# Patient Record
Sex: Female | Born: 1980
Health system: Southern US, Community
[De-identification: ages and names within clinical notes are randomized; demographics above are authoritative.]

## PROBLEM LIST (undated history)

## (undated) DIAGNOSIS — K5289 Other specified noninfective gastroenteritis and colitis: Secondary | ICD-10-CM

## (undated) DIAGNOSIS — K921 Melena: Secondary | ICD-10-CM

## (undated) DIAGNOSIS — M797 Fibromyalgia: Secondary | ICD-10-CM

## (undated) DIAGNOSIS — J45909 Unspecified asthma, uncomplicated: Secondary | ICD-10-CM

## (undated) DIAGNOSIS — IMO0001 Reserved for inherently not codable concepts without codable children: Secondary | ICD-10-CM

## (undated) DIAGNOSIS — K219 Gastro-esophageal reflux disease without esophagitis: Secondary | ICD-10-CM

## (undated) DIAGNOSIS — R109 Unspecified abdominal pain: Secondary | ICD-10-CM

## (undated) DIAGNOSIS — K5900 Constipation, unspecified: Secondary | ICD-10-CM

## (undated) DIAGNOSIS — R42 Dizziness and giddiness: Secondary | ICD-10-CM

## (undated) DIAGNOSIS — K220 Achalasia of cardia: Secondary | ICD-10-CM

## (undated) DIAGNOSIS — R609 Edema, unspecified: Secondary | ICD-10-CM

## (undated) DIAGNOSIS — J309 Allergic rhinitis, unspecified: Secondary | ICD-10-CM

## (undated) HISTORY — DX: Reserved for inherently not codable concepts without codable children: IMO0001

## (undated) HISTORY — DX: Unspecified abdominal pain: R10.9

## (undated) HISTORY — DX: Morbid (severe) obesity due to excess calories: E66.01

## (undated) HISTORY — DX: Unspecified asthma, uncomplicated: J45.909

## (undated) HISTORY — DX: Gastro-esophageal reflux disease without esophagitis: K21.9

## (undated) HISTORY — DX: Edema, unspecified: R60.9

## (undated) HISTORY — DX: Dizziness and giddiness: R42

## (undated) HISTORY — DX: Other specified noninfective gastroenteritis and colitis: K52.89

## (undated) HISTORY — DX: Fibromyalgia: M79.7

## (undated) HISTORY — DX: Allergic rhinitis, unspecified: J30.9

## (undated) HISTORY — DX: Melena: K92.1

## (undated) HISTORY — DX: Achalasia of cardia: K22.0

## (undated) HISTORY — DX: Constipation, unspecified: K59.00

## (undated) HISTORY — PX: CHOLECYSTECTOMY: SHX55

---
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
## 2002-11-21 ENCOUNTER — Encounter: Payer: Self-pay | Admitting: Internal Medicine

## 2002-11-21 ENCOUNTER — Ambulatory Visit (HOSPITAL_COMMUNITY): Admission: RE | Admit: 2002-11-21 | Discharge: 2002-11-21 | Payer: Self-pay | Admitting: Internal Medicine

## 2003-10-11 ENCOUNTER — Other Ambulatory Visit: Admission: RE | Admit: 2003-10-11 | Discharge: 2003-10-11 | Payer: Self-pay | Admitting: Obstetrics and Gynecology

## 2003-10-12 ENCOUNTER — Other Ambulatory Visit: Admission: RE | Admit: 2003-10-12 | Discharge: 2003-10-12 | Payer: Self-pay | Admitting: Obstetrics and Gynecology

## 2004-02-09 ENCOUNTER — Inpatient Hospital Stay (HOSPITAL_COMMUNITY): Admission: AD | Admit: 2004-02-09 | Discharge: 2004-02-09 | Payer: Self-pay | Admitting: Obstetrics and Gynecology

## 2004-02-29 ENCOUNTER — Ambulatory Visit (HOSPITAL_COMMUNITY): Admission: RE | Admit: 2004-02-29 | Discharge: 2004-02-29 | Payer: Self-pay | Admitting: Gastroenterology

## 2004-02-29 ENCOUNTER — Encounter: Payer: Self-pay | Admitting: Gastroenterology

## 2004-02-29 ENCOUNTER — Inpatient Hospital Stay (HOSPITAL_COMMUNITY): Admission: AD | Admit: 2004-02-29 | Discharge: 2004-02-29 | Payer: Self-pay | Admitting: Obstetrics and Gynecology

## 2004-02-29 HISTORY — PX: ESOPHAGOGASTRODUODENOSCOPY: SHX1529

## 2004-03-11 ENCOUNTER — Encounter (INDEPENDENT_AMBULATORY_CARE_PROVIDER_SITE_OTHER): Payer: Self-pay | Admitting: Specialist

## 2004-03-11 ENCOUNTER — Inpatient Hospital Stay (HOSPITAL_COMMUNITY): Admission: AD | Admit: 2004-03-11 | Discharge: 2004-03-13 | Payer: Self-pay | Admitting: Obstetrics and Gynecology

## 2004-04-06 ENCOUNTER — Emergency Department (HOSPITAL_COMMUNITY): Admission: EM | Admit: 2004-04-06 | Discharge: 2004-04-06 | Payer: Self-pay | Admitting: Emergency Medicine

## 2004-06-21 ENCOUNTER — Ambulatory Visit: Payer: Self-pay | Admitting: Internal Medicine

## 2004-07-15 ENCOUNTER — Emergency Department (HOSPITAL_COMMUNITY): Admission: EM | Admit: 2004-07-15 | Discharge: 2004-07-15 | Payer: Self-pay | Admitting: Emergency Medicine

## 2004-07-16 ENCOUNTER — Ambulatory Visit (HOSPITAL_COMMUNITY): Admission: RE | Admit: 2004-07-16 | Discharge: 2004-07-16 | Payer: Self-pay | Admitting: Emergency Medicine

## 2004-07-19 ENCOUNTER — Ambulatory Visit: Payer: Self-pay | Admitting: Internal Medicine

## 2004-08-12 ENCOUNTER — Ambulatory Visit: Payer: Self-pay | Admitting: Pulmonary Disease

## 2004-08-12 ENCOUNTER — Encounter (INDEPENDENT_AMBULATORY_CARE_PROVIDER_SITE_OTHER): Payer: Self-pay | Admitting: *Deleted

## 2004-08-12 ENCOUNTER — Observation Stay (HOSPITAL_COMMUNITY): Admission: RE | Admit: 2004-08-12 | Discharge: 2004-08-13 | Payer: Self-pay | Admitting: General Surgery

## 2005-03-05 ENCOUNTER — Ambulatory Visit: Payer: Self-pay | Admitting: Gastroenterology

## 2005-03-11 ENCOUNTER — Ambulatory Visit: Payer: Self-pay | Admitting: Gastroenterology

## 2005-03-11 ENCOUNTER — Ambulatory Visit (HOSPITAL_COMMUNITY): Admission: RE | Admit: 2005-03-11 | Discharge: 2005-03-11 | Payer: Self-pay | Admitting: Gastroenterology

## 2006-01-15 ENCOUNTER — Emergency Department (HOSPITAL_COMMUNITY): Admission: EM | Admit: 2006-01-15 | Discharge: 2006-01-15 | Payer: Self-pay | Admitting: Emergency Medicine

## 2006-07-23 ENCOUNTER — Ambulatory Visit: Payer: Self-pay | Admitting: Endocrinology

## 2006-07-23 LAB — CONVERTED CEMR LAB
ALT: 18 units/L (ref 0–40)
Alkaline Phosphatase: 56 units/L (ref 39–117)
Basophils Relative: 0 % (ref 0.0–1.0)
Bilirubin, Direct: 0.1 mg/dL (ref 0.0–0.3)
HCT: 36.1 % (ref 36.0–46.0)
Hemoglobin: 11.9 g/dL — ABNORMAL LOW (ref 12.0–15.0)
Lymphocytes Relative: 29.2 % (ref 12.0–46.0)
MCHC: 33 g/dL (ref 30.0–36.0)
Neutrophils Relative %: 60.6 % (ref 43.0–77.0)
Platelets: 291 10*3/uL (ref 150–400)
RBC: 4.55 M/uL (ref 3.87–5.11)
RDW: 13 % (ref 11.5–14.6)
Total Bilirubin: 0.4 mg/dL (ref 0.3–1.2)
Total Protein: 6.6 g/dL (ref 6.0–8.3)

## 2006-12-01 ENCOUNTER — Emergency Department (HOSPITAL_COMMUNITY): Admission: EM | Admit: 2006-12-01 | Discharge: 2006-12-01 | Payer: Self-pay | Admitting: Emergency Medicine

## 2007-02-10 ENCOUNTER — Encounter: Payer: Self-pay | Admitting: *Deleted

## 2007-08-19 ENCOUNTER — Ambulatory Visit: Payer: Self-pay | Admitting: Internal Medicine

## 2007-08-19 DIAGNOSIS — K219 Gastro-esophageal reflux disease without esophagitis: Secondary | ICD-10-CM

## 2007-08-19 DIAGNOSIS — K5289 Other specified noninfective gastroenteritis and colitis: Secondary | ICD-10-CM | POA: Insufficient documentation

## 2007-08-19 DIAGNOSIS — K220 Achalasia of cardia: Secondary | ICD-10-CM | POA: Insufficient documentation

## 2007-08-19 DIAGNOSIS — J45909 Unspecified asthma, uncomplicated: Secondary | ICD-10-CM

## 2007-08-19 DIAGNOSIS — J309 Allergic rhinitis, unspecified: Secondary | ICD-10-CM

## 2007-08-19 HISTORY — DX: Morbid (severe) obesity due to excess calories: E66.01

## 2007-08-19 HISTORY — DX: Gastro-esophageal reflux disease without esophagitis: K21.9

## 2007-08-19 HISTORY — DX: Allergic rhinitis, unspecified: J30.9

## 2007-08-19 HISTORY — DX: Unspecified asthma, uncomplicated: J45.909

## 2007-08-19 HISTORY — DX: Other specified noninfective gastroenteritis and colitis: K52.89

## 2007-08-19 HISTORY — DX: Achalasia of cardia: K22.0

## 2007-12-15 ENCOUNTER — Telehealth (INDEPENDENT_AMBULATORY_CARE_PROVIDER_SITE_OTHER): Payer: Self-pay | Admitting: *Deleted

## 2008-03-10 ENCOUNTER — Ambulatory Visit: Payer: Self-pay | Admitting: Internal Medicine

## 2008-03-10 DIAGNOSIS — K921 Melena: Secondary | ICD-10-CM | POA: Insufficient documentation

## 2008-03-10 HISTORY — DX: Melena: K92.1

## 2008-10-02 ENCOUNTER — Encounter: Payer: Self-pay | Admitting: Internal Medicine

## 2008-10-09 ENCOUNTER — Encounter: Admission: RE | Admit: 2008-10-09 | Discharge: 2008-10-09 | Payer: Self-pay | Admitting: Specialist

## 2008-11-10 ENCOUNTER — Inpatient Hospital Stay (HOSPITAL_COMMUNITY): Admission: AD | Admit: 2008-11-10 | Discharge: 2008-11-10 | Payer: Self-pay | Admitting: Obstetrics & Gynecology

## 2009-01-05 ENCOUNTER — Observation Stay (HOSPITAL_COMMUNITY): Admission: AD | Admit: 2009-01-05 | Discharge: 2009-01-06 | Payer: Self-pay | Admitting: Obstetrics & Gynecology

## 2009-02-05 ENCOUNTER — Telehealth: Payer: Self-pay | Admitting: Gastroenterology

## 2009-02-05 ENCOUNTER — Ambulatory Visit: Payer: Self-pay | Admitting: Gastroenterology

## 2009-04-08 ENCOUNTER — Inpatient Hospital Stay (HOSPITAL_COMMUNITY): Admission: AD | Admit: 2009-04-08 | Discharge: 2009-04-08 | Payer: Self-pay | Admitting: Obstetrics and Gynecology

## 2009-05-21 ENCOUNTER — Ambulatory Visit: Payer: Self-pay | Admitting: Physician Assistant

## 2009-05-21 ENCOUNTER — Inpatient Hospital Stay (HOSPITAL_COMMUNITY): Admission: AD | Admit: 2009-05-21 | Discharge: 2009-05-21 | Payer: Self-pay | Admitting: Obstetrics and Gynecology

## 2009-06-06 ENCOUNTER — Inpatient Hospital Stay (HOSPITAL_COMMUNITY): Admission: RE | Admit: 2009-06-06 | Discharge: 2009-06-08 | Payer: Self-pay | Admitting: Obstetrics and Gynecology

## 2009-07-31 ENCOUNTER — Ambulatory Visit: Payer: Self-pay | Admitting: Internal Medicine

## 2009-08-02 ENCOUNTER — Emergency Department (HOSPITAL_COMMUNITY): Admission: EM | Admit: 2009-08-02 | Discharge: 2009-08-03 | Payer: Self-pay | Admitting: Emergency Medicine

## 2009-08-02 ENCOUNTER — Encounter: Payer: Self-pay | Admitting: Internal Medicine

## 2009-10-10 ENCOUNTER — Ambulatory Visit: Payer: Self-pay | Admitting: Internal Medicine

## 2009-10-10 DIAGNOSIS — R609 Edema, unspecified: Secondary | ICD-10-CM

## 2009-10-10 DIAGNOSIS — IMO0001 Reserved for inherently not codable concepts without codable children: Secondary | ICD-10-CM

## 2009-10-10 DIAGNOSIS — R109 Unspecified abdominal pain: Secondary | ICD-10-CM | POA: Insufficient documentation

## 2009-10-10 HISTORY — DX: Reserved for inherently not codable concepts without codable children: IMO0001

## 2009-10-10 HISTORY — DX: Unspecified abdominal pain: R10.9

## 2009-10-10 HISTORY — DX: Edema, unspecified: R60.9

## 2009-10-10 LAB — CONVERTED CEMR LAB
BUN: 17 mg/dL (ref 6–23)
Basophils Absolute: 0 10*3/uL (ref 0.0–0.1)
Basophils Relative: 0.5 % (ref 0.0–3.0)
Bilirubin Urine: NEGATIVE
CO2: 30 meq/L (ref 19–32)
Calcium: 9.4 mg/dL (ref 8.4–10.5)
Eosinophils Absolute: 0.2 10*3/uL (ref 0.0–0.7)
Eosinophils Relative: 4.2 % (ref 0.0–5.0)
Glucose, Bld: 81 mg/dL (ref 70–99)
HCT: 34.2 % — ABNORMAL LOW (ref 36.0–46.0)
Lymphocytes Relative: 40.3 % (ref 12.0–46.0)
Lymphs Abs: 1.4 10*3/uL (ref 0.7–4.0)
MCHC: 33.7 g/dL (ref 30.0–36.0)
Monocytes Absolute: 0.3 10*3/uL (ref 0.1–1.0)
Neutrophils Relative %: 45.6 % (ref 43.0–77.0)
Platelets: 265 10*3/uL (ref 150.0–400.0)
Sodium: 142 meq/L (ref 135–145)
Total Bilirubin: 0.5 mg/dL (ref 0.3–1.2)
Total CK: 153 units/L (ref 7–177)
Urobilinogen, UA: 0.2 (ref 0.0–1.0)
pH: 6 (ref 5.0–8.0)

## 2009-10-29 ENCOUNTER — Ambulatory Visit: Payer: Self-pay | Admitting: Internal Medicine

## 2009-10-29 DIAGNOSIS — K5900 Constipation, unspecified: Secondary | ICD-10-CM | POA: Insufficient documentation

## 2009-10-29 HISTORY — DX: Constipation, unspecified: K59.00

## 2010-02-13 ENCOUNTER — Ambulatory Visit: Payer: Self-pay | Admitting: Internal Medicine

## 2010-02-13 DIAGNOSIS — R42 Dizziness and giddiness: Secondary | ICD-10-CM

## 2010-02-13 HISTORY — DX: Dizziness and giddiness: R42

## 2010-06-25 NOTE — Assessment & Plan Note (Signed)
Summary: headaches/dizziness/lb   Vital Signs:  Patient profile:   30 year old female Height:      67 inches Weight:      226.50 pounds BMI:     35.60 O2 Sat:      97 % on Room air Temp:     98.3 degrees F oral Pulse rate:   80 / minute BP supine:   102 / 82  (right arm) BP sitting:   110 / 84  (right arm) BP standing:   100 / 80  (right arm) Cuff size:   large  Vitals Entered By: Zella Ball Ewing CMA Duncan Dull) (February 13, 2010 9:35 AM)  O2 Flow:  Room air  CC: Headache, dizzines for 2 weeks/RE   CC:  Headache and dizzines for 2 weeks/RE.  History of Present Illness: here to f/u with acute - c/o 2 wks onset symptoms or fatigue, headaches, early ST now resolved and decreased by mouth intake, now with dizziness on standing;  no falls or injury and Pt denies CP, worsening sob, doe, wheezing, orthopnea, pnd, worsening LE edema, palps, dizziness or syncope  Pt denies new neuro symptoms such as  facial or extremity weakness No fever, wt loss, night sweats, loss of appetite or other constitutional symptoms    Denies worsening depressive symtpoms, suicidal ideation or panic.  Denies polydipsia, polyuria.  Did have dizziness on standing with orthostatic BP as above.  Denies GU symptoms such as dysuria or freq or urgency.  Problems Prior to Update: 1)  Dizziness  (ICD-780.4) 2)  Constipation  (ICD-564.00) 3)  Peripheral Edema  (ICD-782.3) 4)  Abdominal Pain, Unspecified Site  (ICD-789.00) 5)  Myalgia  (ICD-729.1) 6)  Preventive Health Care  (ICD-V70.0) 7)  Hematochezia  (ICD-578.1) 8)  Gastroenteritis  (ICD-558.9) 9)  Obesity, Morbid  (ICD-278.01) 10)  Achalasia  (ICD-530.0) 11)  Gerd  (ICD-530.81) 12)  Asthma  (ICD-493.90) 13)  Allergic Rhinitis  (ICD-477.9)  Medications Prior to Update: 1)  Valtrex 500 Mg Tabs (Valacyclovir Hcl) .Marland Kitchen.. 1 By Mouth Daily 2)  Protonix 40 Mg Tbec (Pantoprazole Sodium) .Marland Kitchen.. 1 By Mouth Once Daily 3)  Promethazine Hcl 25 Mg Tabs (Promethazine Hcl) .Marland Kitchen.. 1po Q  6 Hrs As Needed Nausea 4)  Hydrochlorothiazide 12.5 Mg Caps (Hydrochlorothiazide) .Marland Kitchen.. 1 -2 By Mouth Once Daily As Needed Swelling 5)  Golytely 236 Gm Solr (Peg 3350-Kcl-Nabcb-Nacl-Nasulf) .Marland Kitchen.. 1 Glass Q 30 Min Until Bm 6)  Lactulose  Soln (Lactulose) .... Generic - 30 - 60 Cc Two Times A Day As Needed  Current Medications (verified): 1)  Valtrex 500 Mg Tabs (Valacyclovir Hcl) .Marland Kitchen.. 1 By Mouth Daily 2)  Protonix 40 Mg Tbec (Pantoprazole Sodium) .Marland Kitchen.. 1 By Mouth Once Daily 3)  Promethazine Hcl 25 Mg Tabs (Promethazine Hcl) .Marland Kitchen.. 1po Q 6 Hrs As Needed Nausea 4)  Hydrochlorothiazide 12.5 Mg Caps (Hydrochlorothiazide) .Marland Kitchen.. 1 -2 By Mouth Once Daily As Needed Swelling 5)  Golytely 236 Gm Solr (Peg 3350-Kcl-Nabcb-Nacl-Nasulf) .Marland Kitchen.. 1 Glass Q 30 Min Until Bm 6)  Lactulose  Soln (Lactulose) .... Generic - 30 - 60 Cc Two Times A Day As Needed  Allergies (verified): 1)  ! 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  , not ill appearing Head:  normocephalic and atraumatic.   Eyes:  vision grossly intact, pupils equal, and pupils round.   Ears:  bilat tm's mild erythema Nose:  no external deformity and no nasal discharge.   Mouth:  pharyngeal erythema and fair dentition.   Neck:  supple and no masses.   Lungs:  normal respiratory effort and normal breath sounds.   Heart:  normal rate and regular rhythm.   Abdomen:  soft, non-tender, and normal bowel sounds.   Msk:  no joint tenderness  and no joint swelling.   Extremities:  no edema, no erythema  Neurologic:  cranial nerves II-XII intact, strength normal in all extremities, sensation intact to light touch, and gait normal.   Skin:  color normal and no rashes.   Psych:  not anxious appearing and not depressed appearing.     Impression & Recommendations:  Problem # 1:  DIZZINESS (ICD-780.4)  Her updated medication list for this problem includes:    Promethazine Hcl 25 Mg Tabs (Promethazine hcl) .Marland Kitchen... 1po q 6 hrs as needed nausea by exam  - c/w  mild dehydration by orthostatics. pt declines labs today,  to drink more fluids, f/u as needed any worsenng symtpoms;  by exam I suspect she may have had recent URI now resolved  Complete Medication List: 1)  Valtrex 500 Mg Tabs (Valacyclovir hcl) .Marland Kitchen.. 1 by mouth daily 2)  Protonix 40 Mg Tbec (Pantoprazole sodium) .Marland Kitchen.. 1 by mouth once daily 3)  Promethazine Hcl 25 Mg Tabs (Promethazine hcl) .Marland Kitchen.. 1po q 6 hrs as needed nausea 4)  Hydrochlorothiazide 12.5 Mg Caps (Hydrochlorothiazide) .Marland Kitchen.. 1 -2 by mouth once daily as needed swelling 5)  Golytely 236 Gm Solr (Peg 3350-kcl-nabcb-nacl-nasulf) .Marland Kitchen.. 1 glass q 30 min until bm 6)  Lactulose Soln (Lactulose) .... Generic - 30 - 60 cc two times a day as needed  Patient Instructions: 1)  Continue all previous medications as before this visit  2)  drink more fluids in the next few days 3)  Please schedule a follow-up appointment as needed if you feel worse

## 2010-06-25 NOTE — Assessment & Plan Note (Signed)
Summary: pain in legs and ankles/stomach pain-lb   Vital Signs:  Patient profile:   30 year old female Height:      67 inches Weight:      220.50 pounds BMI:     34.66 O2 Sat:      96 % on Room air Temp:     97.2 degrees F oral Pulse rate:   81 / minute BP sitting:   100 / 62  (left arm) Cuff size:   large  Vitals Entered ByZella Ball Ewing (Oct 10, 2009 11:52 AM)  O2 Flow:  Room air CC: Legs and ankle pain/RE   CC:  Legs and ankle pain/RE.  History of Present Illness: here with 1.5 wks sharp and achy mild to mod persistent pains to bilat ankles and legs , as well as intermittent abd pains that can  be severe;  no fever, n/v/d or constipation, no blood, or GU symptoms such as dyruia, or freq or urgency;  abd pain will radiate to the back - worse to sit up, better to lie down;  has persistent sweling to the legs for 4 mo (since the last childs birth);  trying to be active and lose wt by riding stat bike at the gym, and trying to eat better;  last some wt initially, regained some back.  Pt denies CP, sob, doe, wheezing, orthopnea, pnd, worsening LE edema, palps, dizziness or syncope   Pt denies new neuro symptoms such as headache, facial or extremity weakness   No specific joint pains .  Drinks plenty of fluids every day , and now with diffuse puffiness to all extremities as well in the past few wks as well.  No hx of renal or other organ dysfunction, recent nsaid or other otc med use.  No overt bleeding or heavy periods.  not pregnant per pt  Problems Prior to Update: 1)  Peripheral Edema  (ICD-782.3) 2)  Abdominal Pain, Unspecified Site  (ICD-789.00) 3)  Myalgia  (ICD-729.1) 4)  Preventive Health Care  (ICD-V70.0) 5)  Hematochezia  (ICD-578.1) 6)  Gastroenteritis  (ICD-558.9) 7)  Obesity, Morbid  (ICD-278.01) 8)  Achalasia  (ICD-530.0) 9)  Gerd  (ICD-530.81) 10)  Asthma  (ICD-493.90) 11)  Allergic Rhinitis  (ICD-477.9)  Medications Prior to Update: 1)  Valtrex 500 Mg Tabs  (Valacyclovir Hcl) .Marland Kitchen.. 1 By Mouth Daily 2)  Protonix 40 Mg Tbec (Pantoprazole Sodium) .Marland Kitchen.. 1 By Mouth Once Daily 3)  Promethazine Hcl 25 Mg Tabs (Promethazine Hcl) .Marland Kitchen.. 1po Q 6 Hrs As Needed Nausea  Current Medications (verified): 1)  Valtrex 500 Mg Tabs (Valacyclovir Hcl) .Marland Kitchen.. 1 By Mouth Daily 2)  Protonix 40 Mg Tbec (Pantoprazole Sodium) .Marland Kitchen.. 1 By Mouth Once Daily 3)  Promethazine Hcl 25 Mg Tabs (Promethazine Hcl) .Marland Kitchen.. 1po Q 6 Hrs As Needed Nausea 4)  Ciprofloxacin Hcl 500 Mg Tabs (Ciprofloxacin Hcl) .Marland Kitchen.. 1 By Mouth Two Times A Day 5)  Hydrochlorothiazide 12.5 Mg Caps (Hydrochlorothiazide) .Marland Kitchen.. 1 -2 By Mouth Once Daily As Needed Swelling  Allergies (verified): 1)  ! 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   Head:  normocephalic and atraumatic.   Eyes:  vision grossly intact, pupils equal, and pupils round.   Ears:  R ear normal and L ear normal.   Nose:  no external deformity and no nasal discharge.   Mouth:  no gingival abnormalities and pharynx pink and moist.   Neck:  supple and no masses.   Lungs:  normal respiratory effort and normal breath sounds.   Heart:  normal rate and regular rhythm.   Abdomen:  soft and normal bowel sounds. with mild to mod mid abd tender and mid low mid abd tedner as well, but no guarding or rebound Msk:  no joint tenderness and no joint swelling.  , has some tender to bilat trapezoid area and ant thigh as well Extremities:  diffuse  mild edema to extremities  - ankles and hands Neurologic:  cranial nerves II-XII intact, strength normal in all extremities, sensation intact to light touch, and gait normal.     Impression & Recommendations:  Problem # 1:  MYALGIA (ICD-729.1)  ? etiology; doubt med related, ? infectious such as UTI related or viral or other;  to check cpk, esr to f/o msk as well  Orders: TLB-CK Total Only(Creatine Kinase/CPK) (82550-CK) TLB-Sedimentation Rate (ESR) (85652-ESR)  Problem # 2:  ABDOMINAL PAIN, UNSPECIFIED SITE (ICD-789.00)  mid and low mid abd  - to check labs, including urine studies, ok for cipro empiric for ? UTI, consider abd films or ct but for other labs first  Orders: TLB-Udip w/ Micro (81001-URINE) T-Culture, Urine (59563-87564) TLB-BMP (Basic Metabolic Panel-BMET) (80048-METABOL) TLB-CBC Platelet - w/Differential (85025-CBCD) TLB-Hepatic/Liver Function Pnl (80076-HEPATIC) TLB-Lipase (83690-LIPASE)  Problem # 3:  PERIPHERAL EDEMA (ICD-782.3)  ok to stop forced fluids,  ok for hctz 12.5 as needed ; to check renal function as well   Her updated medication list for this problem includes:    Hydrochlorothiazide 12.5 Mg Caps (Hydrochlorothiazide) .Marland Kitchen... 1 -2 by mouth once daily as needed swelling  Complete Medication List: 1)  Valtrex 500 Mg Tabs (Valacyclovir hcl) .Marland Kitchen.. 1 by mouth daily 2)  Protonix 40 Mg Tbec (Pantoprazole sodium) .Marland Kitchen.. 1 by mouth once daily 3)  Promethazine Hcl 25 Mg Tabs (Promethazine hcl) .Marland Kitchen.. 1po q 6 hrs as needed nausea 4)  Ciprofloxacin Hcl 500 Mg Tabs (Ciprofloxacin hcl) .Marland Kitchen.. 1 by mouth two times a day 5)  Hydrochlorothiazide 12.5 Mg Caps (Hydrochlorothiazide) .Marland Kitchen.. 1 -2 by mouth once daily as needed swelling  Patient Instructions: 1)  Please take all new medications as prescribed - the antibioitic and fluid pill 2)  Please go to the Lab in the basement for your blood and/or urine tests today 3)  Continue all previous medications as before this visit    4)  Please schedule a follow-up appointment as needed. Prescriptions: HYDROCHLOROTHIAZIDE 12.5 MG CAPS (HYDROCHLOROTHIAZIDE) 1 -2 by mouth once daily as needed swelling  #60 x 1   Entered and Authorized by:   Corwin Levins MD   Signed by:   Corwin Levins MD on 10/10/2009   Method used:   Print then Give to Patient   RxID:   [id number] CIPROFLOXACIN HCL 500 MG TABS (CIPROFLOXACIN HCL) 1 by mouth two times a day  #20 x 0   Entered and Authorized by:   Corwin Levins MD   Signed by:   Corwin Levins MD on 10/10/2009   Method used:   Print then Give to Patient  RxID:   [id number]

## 2010-06-25 NOTE — Letter (Signed)
Summary: Health Exam Form/[employer]  [facility]   Imported By: Sherian Rein 08/06/2009 10:14:31  _____________________________________________________________________  External Attachment:    Type:   Image     Comment:   External Document

## 2010-06-25 NOTE — Assessment & Plan Note (Signed)
Summary: form/tb skin test/#/cd   Vital Signs:  Patient profile:   30 year old female Height:      67 inches Weight:      224.50 pounds BMI:     35.29 O2 Sat:      98 % on Room air Temp:     97.2 degrees F oral Pulse rate:   74 / minute BP sitting:   110 / 80  (left arm) Cuff size:   large  Vitals Entered ByZella Ball Ewing (July 31, 2009 11:15 AM)  O2 Flow:  Room air CC: Form completion/RE   CC:  Form completion/RE.  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    Problems Prior to Update: 1)  Hematochezia  (ICD-578.1) 2)  Gastroenteritis  (ICD-558.9) 3)  Obesity, Morbid  (ICD-278.01) 4)  Achalasia  (ICD-530.0) 5)  Gerd  (ICD-530.81) 6)  Asthma  (ICD-493.90) 7)  Allergic Rhinitis  (ICD-477.9)  Medications Prior to Update: 1)  Valtrex 500 Mg Tabs (Valacyclovir Hcl) .Marland Kitchen.. 1 By Mouth Daily 2)  Protonix 40 Mg Tbec (Pantoprazole Sodium) .Marland Kitchen.. 1 By Mouth Once Daily 3)  Promethazine Hcl 25 Mg Tabs (Promethazine Hcl) .Marland Kitchen.. 1po Q 6 Hrs As Needed Nausea  Current Medications (verified): 1)  Valtrex 500 Mg Tabs (Valacyclovir Hcl) .Marland Kitchen.. 1 By Mouth Daily 2)  Protonix 40 Mg Tbec (Pantoprazole Sodium) .Marland Kitchen.. 1 By Mouth Once Daily 3)  Promethazine Hcl 25 Mg Tabs (Promethazine Hcl) .Marland Kitchen.. 1po Q 6 Hrs As Needed Nausea  Allergies (verified): 1)  ! 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 DM HTN heart disease  Social History: Reviewed history from 08/19/2007 and no changes required. 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         all otherwise negative per pt -    Physical Exam  General:  alert and overweight-appearing.   Head:  normocephalic and atraumatic.   Eyes:  vision grossly intact, pupils equal, and pupils round.   Ears:  R ear normal and L ear normal.   Nose:  no external deformity and no nasal discharge.   Mouth:  no gingival abnormalities and pharynx pink and moist.   Neck:  supple and no masses.   Lungs:  normal respiratory effort and normal breath sounds.   Heart:  normal rate  and regular rhythm.   Abdomen:  soft, non-tender, and normal bowel sounds.   Msk:  no joint tenderness and no joint swelling.   Extremities:  no edema, no erythema  Neurologic:  cranial nerves II-XII intact and strength normal in all extremities.   Skin:  color normal and no rashes.   Psych:  not anxious appearing and not depressed appearing.     Impression & Recommendations:  Problem # 1:  Preventive Health Care (ICD-V70.0) Overall doing well, age appropriate  education and counseling updated and referral for appropriate preventive services done unless declined, immunizations up to date or declined, diet counseling done if overweight, urged to quit smoking if smokes , most recent labs reviewed and current ordered if appropriate, ecg reviewed or declined (interpretation per ECG scanned in the EMR if done); information regarding Medicare Prevention requirements given if appropriate , declines labs today  Complete Medication List: 1)  Valtrex 500 Mg Tabs (Valacyclovir hcl) .Marland Kitchen.. 1 by mouth daily 2)  Protonix 40 Mg Tbec (Pantoprazole sodium) .Marland Kitchen.. 1 by mouth once daily 3)  Promethazine Hcl 25 Mg Tabs (Promethazine hcl) .Marland Kitchen.. 1po q 6 hrs as needed nausea  Other Orders: Tdap => 34yrs IM (10272) TB Skin Test [id number]) Admin 1st Vaccine (40347) Admin of Any Addtl Vaccine (42595)  Patient Instructions: 1)  you had the TB skin test placed today 2)  please return in 48 to 72 hours to have the nurse evaluate the test result 3)  please bring your form back at that time so that is can be signed 4)  Continue all previous medications as before this visit  5)  you had the tetanus shot today 6)  Please schedule a follow-up appointment in 1 year or as needed    Immunizations Administered:  Tetanus Vaccine:    Vaccine Type: Tdap    Site: left deltoid    Mfr: GlaxoSmithKline    Dose: 0.5 ml    Route: IM    Given by: Robin Ewing    Exp. Date: 03/21/2010    Lot #: [id number]    VIS given: 04/13/07 version given July 31, 2009.  PPD Skin Test:    Vaccine Type: PPD    Site: left forearm    Mfr: Sanofi Pasteur    Dose: 0.1 ml    Route: ID    Given by: Zella Ball Ewing    Exp. Date: 05/11/2011    Lot #: [id number]     Appended Document: Immunization Entry      PPD Results    Date of reading: 08/03/2009    Results: < 5mm    Interpretation: negative

## 2010-06-25 NOTE — Letter (Signed)
Summary: [facility]  [facility]   Imported By: Lester Winslow 08/09/2009 08:42:34  _____________________________________________________________________  External Attachment:    Type:   Image     Comment:   External Document

## 2010-06-25 NOTE — Assessment & Plan Note (Signed)
Summary: NO BOWEL MOVEMENT FOR 1 1/2 WKS /NWS   Vital Signs:  Patient profile:   30 year old female Height:      67 inches Weight:      227.50 pounds BMI:     35.76 O2 Sat:      96 % on Room air Temp:     97.7 degrees F oral Pulse rate:   85 / minute BP sitting:   100 / 62  (left arm) Cuff size:   large  Vitals Entered ByZella Ball Ewing (October 29, 2009 2:05 PM)  O2 Flow:  Room air CC: Problems having Bowel Movement/RE   CC:  Problems having Bowel Movement/RE.  History of Present Illness: overall doing ok, except no BM for 8 days, has signfiicant nausea, vomited x 1 yesterday, with crampy abd pain, feels miserable,  pain radaites to the back;  no fever, diarrhea, blood, dizzy or rash.  Has tried mult OTC preps including the laxative, suppos, powders, enema and mag citrate.  Pt denies CP, sob, doe, wheezing, orthopnea, pnd, worsening LE edema, palps, dizziness or syncope   Pt denies new neuro symptoms such as headache, facial or extremity weakness Overall good med compliance and toleracne.  No recent diet changes.  Not very active with excercise.  No recent fever, GU symtpoms, dysphagia or wt loss   Problems Prior to Update: 1)  Constipation  (ICD-564.00) 2)  Peripheral Edema  (ICD-782.3) 3)  Abdominal Pain, Unspecified Site  (ICD-789.00) 4)  Myalgia  (ICD-729.1) 5)  Preventive Health Care  (ICD-V70.0) 6)  Hematochezia  (ICD-578.1) 7)  Gastroenteritis  (ICD-558.9) 8)  Obesity, Morbid  (ICD-278.01) 9)  Achalasia  (ICD-530.0) 10)  Gerd  (ICD-530.81) 11)  Asthma  (ICD-493.90) 12)  Allergic Rhinitis  (ICD-477.9)  Medications Prior to Update: 1)  Valtrex 500 Mg Tabs (Valacyclovir Hcl) .Marland Kitchen.. 1 By Mouth Daily 2)  Protonix 40 Mg Tbec (Pantoprazole Sodium) .Marland Kitchen.. 1 By Mouth Once Daily 3)  Promethazine Hcl 25 Mg Tabs (Promethazine Hcl) .Marland Kitchen.. 1po Q 6 Hrs As Needed Nausea 4)  Ciprofloxacin Hcl 500 Mg Tabs (Ciprofloxacin Hcl) .Marland Kitchen.. 1 By Mouth Two Times A Day 5)  Hydrochlorothiazide 12.5 Mg Caps  (Hydrochlorothiazide) .Marland Kitchen.. 1 -2 By Mouth Once Daily As Needed Swelling  Current Medications (verified): 1)  Valtrex 500 Mg Tabs (Valacyclovir Hcl) .Marland Kitchen.. 1 By Mouth Daily 2)  Protonix 40 Mg Tbec (Pantoprazole Sodium) .Marland Kitchen.. 1 By Mouth Once Daily 3)  Promethazine Hcl 25 Mg Tabs (Promethazine Hcl) .Marland Kitchen.. 1po Q 6 Hrs As Needed Nausea 4)  Hydrochlorothiazide 12.5 Mg Caps (Hydrochlorothiazide) .Marland Kitchen.. 1 -2 By Mouth Once Daily As Needed Swelling 5)  Golytely 236 Gm Solr (Peg 3350-Kcl-Nabcb-Nacl-Nasulf) .Marland Kitchen.. 1 Glass Q 30 Min Until Bm 6)  Lactulose  Soln (Lactulose) .... Generic - 30 - 60 Cc Two Times A Day As Needed  Allergies (verified): 1)  ! 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   Head:  normocephalic and atraumatic.   Eyes:  vision grossly intact, pupils equal,  and pupils round.   Ears:  R ear normal and L ear normal.   Nose:  no external deformity and no nasal discharge.   Mouth:  no gingival abnormalities and pharynx pink and moist.   Neck:  supple and no masses.   Lungs:  normal respiratory effort and normal breath sounds.   Heart:  normal rate and regular rhythm.   Abdomen:  soft and normal bowel sounds.  with diffuse mild tenderness, no guarding or rebound Extremities:  no edema, no erythema    Impression & Recommendations:  Problem # 1:  ABDOMINAL PAIN, UNSPECIFIED SITE (ICD-789.00)  for film to r/o obstruction, then golytely until BM, then more long term lactulose  as needed , also for daily stool softner  Orders: T-Abdomen 2-view (74020TC)  Problem # 2:  ACHALASIA (ICD-530.0) stable overall by hx and exam, ok to continue meds/tx as is   Complete Medication List: 1)  Valtrex 500 Mg Tabs (Valacyclovir hcl) .Marland Kitchen.. 1 by mouth daily 2)  Protonix 40 Mg Tbec (Pantoprazole sodium) .Marland Kitchen.. 1 by mouth once daily 3)  Promethazine Hcl 25 Mg Tabs (Promethazine hcl) .Marland Kitchen.. 1po q 6 hrs as needed nausea 4)  Hydrochlorothiazide 12.5 Mg Caps (Hydrochlorothiazide) .Marland Kitchen.. 1 -2 by mouth once daily as needed swelling 5)  Golytely 236 Gm Solr (Peg 3350-kcl-nabcb-nacl-nasulf) .Marland Kitchen.. 1 glass q 30 min until bm 6)  Lactulose Soln (Lactulose) .... Generic - 30 - 60 cc two times a day as needed  Patient Instructions: 1)  Please go to Radiology in the basement level for your X-Ray today  2)  Please take all new medications as prescribed  3)  Continue all previous medications as before this visit  4)  Please schedule a follow-up appointment as needed. Prescriptions: LACTULOSE  SOLN (LACTULOSE) generic - 30 - 60 cc two times a day as needed  #1 x 3   Entered and Authorized by:   Corwin Levins MD   Signed by:   Corwin Levins MD on 10/29/2009   Method used:   Print then Give to Patient   RxID:   [id number] GOLYTELY 236 GM SOLR (PEG 3350-KCL-NABCB-NACL-NASULF) 1 glass q 30 min until BM  #1 x 0   Entered and Authorized by:   Corwin Levins MD   Signed by:   Corwin Levins MD on 10/29/2009   Method used:   Print then Give to Patient   RxID:   [id number]

## 2010-08-11 LAB — COMPREHENSIVE METABOLIC PANEL
ALT: 11 U/L (ref 0–35)
AST: 17 U/L (ref 0–37)
Albumin: 2.2 g/dL — ABNORMAL LOW (ref 3.5–5.2)
Calcium: 8.7 mg/dL (ref 8.4–10.5)
GFR calc non Af Amer: >60 mL/min (ref >60)
Potassium: 3 mEq/L — ABNORMAL LOW (ref 3.5–5.1)
Sodium: 136 mEq/L (ref 135–145)
Total Bilirubin: 0.6 mg/dL (ref 0.3–1.2)
Total Protein: 5 g/dL — ABNORMAL LOW (ref 6.0–8.3)

## 2010-08-11 LAB — CBC
Hemoglobin: 10.4 g/dL — ABNORMAL LOW (ref 12.0–15.0)
MCHC: 32.8 g/dL (ref 30.0–36.0)
MCV: 77.8 fL — ABNORMAL LOW (ref 78.0–100.0)
MCV: 79.2 fL (ref 78.0–100.0)
Platelets: 203 10*3/uL (ref 150–400)
Platelets: 210 10*3/uL (ref 150–400)
RBC: 3.92 MIL/uL (ref 3.87–5.11)
RBC: 4.1 MIL/uL (ref 3.87–5.11)
RDW: 12.9 % (ref 11.5–15.5)
RDW: 13.6 % (ref 11.5–15.5)

## 2010-08-11 LAB — URIC ACID: Uric Acid, Serum: 4.4 mg/dL (ref 2.4–7.0)

## 2010-08-11 LAB — RPR: RPR Ser Ql: NONREACTIVE

## 2010-08-18 LAB — POCT CARDIAC MARKERS
CKMB, poc: <1 ng/mL — ABNORMAL LOW (ref 1.0–8.0)
Myoglobin, poc: 86.3 ng/mL (ref 12–200)
Troponin i, poc: <0.05 ng/mL (ref 0.00–0.09)

## 2010-08-18 LAB — POCT I-STAT, CHEM 8
Chloride: 101 mEq/L (ref 96–112)
Creatinine, Ser: 1.1 mg/dL (ref 0.4–1.2)
Potassium: 3.5 mEq/L (ref 3.5–5.1)

## 2010-08-18 LAB — CBC
MCHC: 32.5 g/dL (ref 30.0–36.0)
RDW: 17.4 % — ABNORMAL HIGH (ref 11.5–15.5)

## 2010-08-18 LAB — DIFFERENTIAL
Eosinophils Absolute: 0.1 10*3/uL (ref 0.0–0.7)
Eosinophils Relative: 3 % (ref 0–5)
Lymphocytes Relative: 20 % (ref 12–46)
Lymphs Abs: 1 10*3/uL (ref 0.7–4.0)
Monocytes Absolute: 0.4 10*3/uL (ref 0.1–1.0)
Monocytes Relative: 8 % (ref 3–12)
Neutrophils Relative %: 69 % (ref 43–77)

## 2010-08-26 LAB — CBC
HCT: 30.9 % — ABNORMAL LOW (ref 36.0–46.0)
Hemoglobin: 10.2 g/dL — ABNORMAL LOW (ref 12.0–15.0)
MCHC: 32.9 g/dL (ref 30.0–36.0)
Platelets: 219 10*3/uL (ref 150–400)
RBC: 3.91 MIL/uL (ref 3.87–5.11)
WBC: 5.9 10*3/uL (ref 4.0–10.5)

## 2010-08-26 LAB — COMPREHENSIVE METABOLIC PANEL
ALT: 11 U/L (ref 0–35)
AST: 18 U/L (ref 0–37)
Alkaline Phosphatase: 128 U/L — ABNORMAL HIGH (ref 39–117)
BUN: 4 mg/dL — ABNORMAL LOW (ref 6–23)
Calcium: 8.5 mg/dL (ref 8.4–10.5)
Chloride: 103 mEq/L (ref 96–112)
GFR calc Af Amer: >60 mL/min (ref >60)
Potassium: 2.8 mEq/L — ABNORMAL LOW (ref 3.5–5.1)
Total Bilirubin: 0.6 mg/dL (ref 0.3–1.2)

## 2010-08-26 LAB — URIC ACID: Uric Acid, Serum: 4.8 mg/dL (ref 2.4–7.0)

## 2010-08-28 LAB — URINALYSIS, ROUTINE W REFLEX MICROSCOPIC
Bilirubin Urine: NEGATIVE
Hgb urine dipstick: NEGATIVE
Ketones, ur: >80 mg/dL — AB
Specific Gravity, Urine: >1.03 — ABNORMAL HIGH (ref 1.005–1.030)

## 2010-08-28 LAB — URINE MICROSCOPIC-ADD ON

## 2010-08-31 LAB — CBC
HCT: 32.4 % — ABNORMAL LOW (ref 36.0–46.0)
MCHC: 34.2 g/dL (ref 30.0–36.0)
Platelets: 204 10*3/uL (ref 150–400)
RDW: 12.9 % (ref 11.5–15.5)
WBC: 7 10*3/uL (ref 4.0–10.5)

## 2010-08-31 LAB — URINALYSIS, DIPSTICK ONLY
Glucose, UA: NEGATIVE mg/dL
Leukocytes, UA: NEGATIVE
Specific Gravity, Urine: <1.005 — ABNORMAL LOW (ref 1.005–1.030)
pH: 6.5 (ref 5.0–8.0)

## 2010-09-02 LAB — URINALYSIS, ROUTINE W REFLEX MICROSCOPIC
Ketones, ur: NEGATIVE mg/dL
Nitrite: NEGATIVE
Specific Gravity, Urine: 1.02 (ref 1.005–1.030)
pH: 6 (ref 5.0–8.0)

## 2010-09-02 LAB — CBC
Hemoglobin: 11.9 g/dL — ABNORMAL LOW (ref 12.0–15.0)
RBC: 4.2 MIL/uL (ref 3.87–5.11)
RDW: 13.4 % (ref 11.5–15.5)

## 2010-10-11 NOTE — Discharge Summary (Signed)
[facility]  Patient:    Lori Carpenter, Lori Carpenter Visit Number: [id number] MRN: [id number]          Service Type: OBS Location: 910A [id number] Attending Physician:  Cordelia Pen Ii Dictated by:   Danie Chandler, R.N. Admit Date:  08/13/2001 Discharge Date: 08/16/2001                             Discharge Summary  ADMISSION DIAGNOSIS:          Intrauterine pregnancy at term in labor, rule out ruptured membranes.  DISCHARGE DIAGNOSES:          1. Intrauterine pregnancy at term in labor, rule                                  out ruptured membranes.                               2. Pregnancy-induced hypertension.                               3. Spontaneous vaginal delivery of a viable female                                  infant with Apgars of 8 at one minute and                                  9 at five minutes over a second degree                                  perineal laceration and a left periurethral                                  laceration.  PROCEDURES:                   On August 13, 2001, at 1542 hours, a spontaneous vaginal delivery of a viable female infant with Apgars of 8 at one minute and 9 at five minutes over a second degree perineal laceration and a left periurethral laceration, which were repair.  HISTORY OF PRESENT ILLNESS:   The patient is a 30 year old, black, single female, gravida 1, para 0, with an estimated date of delivery of September 06, 2001, who was admitted complaining of labor and possible rupture of membranes. She was admitted and subsequently delivered a viable female infant on August 13, 2001, at 1542 hours with Apgars of 8 at one minute and 9 at five minutes over a second degree perineal laceration and a left periurethral laceration, which which were repaired.  HOSPITAL COURSE:              On hospital day #1, the patient was on magnesium sulfate.  She was without complaint.  Her vital signs were stable.  The  blood pressure was 122/76.  Her urine output was 50-100 cc/hr.  The hemoglobin on this day was 9.3, hematocrit 27.7, white blood cell  count 8.3, and platelets 181,000.  Her deep tendon reflexes were 1+ without clonus.  Her magnesium sulfate was discontinued later that day.  On postpartum day #2, blood pressures were 153-88/160-100.  She remained stable and her blood pressure was continued to be followed.  On postpartum day #3, she remained without complaints.  She denied headache, visual change, or right upper quadrant pain.  The blood pressure that morning was 154/90.  The deep tendon reflexes were 1+ without clonus.  She was started on Procardia XL that morning and she was discharged home later that day.  CONDITION ON DISCHARGE:       Improved.  DIET:                         Regular as tolerated.  ACTIVITY:                     No heavy lifting.  No driving.  No vaginal entry.  FOLLOW-UP:                    She is to follow up in the office in 7-10 days for blood pressure check.  She is call for temperature greater than 100 degrees, persistent nausea or vomiting, any heavy vaginal bleeding, or any signs or symptoms of PIH.  DISCHARGE MEDICATIONS:        1. Prenatal vitamins one p.o. q.d.                               2. Nu-Iron 150 mg one p.o. q.d.                               3. Procardia XL 30 mg q.d. Dictated by:   Danie Chandler, R.N. Attending Physician:  Soledad Gerlach DD:  09/03/01 TD:  09/04/01 Job: [id number]

## 2010-10-11 NOTE — Procedures (Signed)
[facility]  Patient:    [name], [name]                      MRN: [id number] Proc. Date: 08/14/99 Adm. Date:  [id number] Disc. Date: [id number] Attending:  Judeth Cornfield                           Procedure Report  PROCEDURE:  Esophageal manometry.  BRIEF HISTORY:  [name] has achalasia.  She is status post laparoscopic myotomy. She has recurrent dysphagia to solids and liquids.  Esophageal manometry was performed in the usual fashion with a pull-through technique.  FINDINGS: 1. Upper esophageal sphincter pressure, contractions, and relaxation were normal. 2. Throughout the body of the esophagus, there were no peristaltic contractions.    70%-80% of the contractions were non-transmitted, and there were significant  numbers of retrograde contractions. 3. LES resting pressure was 7.7 mm (normal 10-45 mmHg).  Relaxation was incomplete    at 62%, though the residual pressure was only 2.9 mm.  IMPRESSION:  Achalasia with findings consistent with a myotomy. DD:  08/14/99 TD:  08/14/99 Job: [id number]

## 2010-10-11 NOTE — Op Note (Signed)
NAMELAVANA, Lori Carpenter NO.:  [id number]   MEDICAL RECORD NO.:  [id number]          PATIENT TYPE:  AMB   LOCATION:  DAY                          FACILITY:  [facility]   PHYSICIAN:  Angelia Mould. Derrell Lolling, M.D.DATE OF BIRTH:  15-Aug-1980   DATE OF PROCEDURE:  08/12/2004  DATE OF DISCHARGE:                                 OPERATIVE REPORT   PREOPERATIVE DIAGNOSIS:  Chronic cholecystitis with cholelithiasis.   POSTOPERATIVE DIAGNOSIS:  Chronic cholecystitis with cholelithiasis.   OPERATION PERFORMED:  Laparoscopic cholecystectomy with intraoperative  cholangiogram.   SURGEON:  Angelia Mould. Derrell Lolling, M.D.   ASSISTANT:  Currie Paris, M.D.   ANESTHESIA:  General.   INDICATIONS FOR PROCEDURE:  This is a 30 year old black female who has had a  Heller myotomy and toupet fundoplication six years ago for achalasia.  She  has done well with that.  She has been recently having intermittent episodes  of epigastric pain radiating to her back.  She has had lab work which showed  mild elevation of SGOT and SGPT.  Ultrasound showed gallstones.  Everything  else looked normal.  She has been evaluated as  an outpatient.  Elective  cholecystectomy has been recommended.  The patient was brought to the  operating room electively.   OPERATIVE FINDINGS:  The gallbladder was somewhat discolored and contained  gallstones.  It was partially intrahepatic.  The anatomy of the cystic duct  and cystic artery and common bile duct were conventional.  The cholangiogram  was normal, showing intrahepatic and extrahepatic biliary anatomy, no  filling defects and no obstruction with good flow of contrast into the  duodenum.  The liver, small intestine, large intestine, and peritoneal  surfaces all looked normal to inspection.   DESCRIPTION OF PROCEDURE:  Following the induction of general endotracheal  anesthesia, the patient's abdomen was prepped and draped in sterile fashion.  0.5% Marcaine with  epinephrine was used as local infiltration anesthetic.  A  vertically oriented incision was made at the lower rim of the umbilicus.  The fascia was incised in the midline and the abdominal cavity was entered  under direct vision.  A 10 mm Hasson trocar was inserted and secured with a  pursestring suture of 0 Vicryl.  Pneumoperitoneum was created.  Video camera  was inserted with visualization and findings as described above.  A 10 mm  trocar was placed in the subxiphoid region and two 5 mm trocars placed in  the right mid abdomen.  The gallbladder fundus was identified and elevated.  The infundibulum was identified and retracted laterally.  The peritoneum was  resected off of the neck of the gallbladder exposing the cystic duct and the  cystic artery.  The cystic duct was dissected for a fair length until we  could have a nice window behind it exposing a critical view.  The  cholangiogram catheter was inserted into the cystic duct.  A cholangiogram  was obtained using a C-arm.  This showed normal findings with normal biliary  anatomy, no filling defects and no obstruction.  The cholangiogram catheter  was removed.  The  cystic duct was secured with multiple metal clips and  divided.  The cystic artery was isolated and secured with multiple metal  clips and divided.  The gallbladder was dissected from its bed with  electrocautery.  The gallbladder was entered once and spilled a little bit  of bile but no stones.  The gallbladder was placed in a specimen bag and  removed through the umbilical port.  The operative field was copiously  irrigated until all the irrigation fluid was completely clear both under the  gallbladder and on top of the liver.  The gallbladder bed was perfectly  hemostatic.  There was no bleeding and no bile leak whatsoever.  The trocars  were removed under direct vision and there was no bleeding from the trocar  sites.  The pneumoperitoneum was released.  The fascia at the  umbilicus was  closed with 0 Vicryl sutures.  Skin incisions were closed with subcuticular  sutures of 4-0 Vicryl and Steri-Strips.  Clean bandages were placed and  patient taken to the recovery room in stable condition.  The estimated blood  loss was about 20 mL.  Complications were none.  Sponge, needle and  instrument counts were correct.      HMI/MEDQ  D:  08/12/2004  T:  08/12/2004  Job:  [id number]   cc:   Corwin Levins, M.D. [facility]

## 2010-10-11 NOTE — H&P (Signed)
NAMELAVENA, Carpenter NO.:  [id number]   MEDICAL RECORD NO.:  [id number]          PATIENT TYPE:  INP   LOCATION:  9162                          FACILITY:  [facility]   PHYSICIAN:  Maxie Better, M.D.DATE OF BIRTH:  1981-01-01   DATE OF ADMISSION:  03/11/2004  DATE OF DISCHARGE:                                HISTORY & PHYSICAL   CHIEF COMPLAINT:  Induction of labor, secondary to symptomatic achalasia.   HISTORY OF PRESENT ILLNESS:  A 30 year old gravida 3, para 0, 1-1-1 female,  EDC of March 20, 2004 by ultrasound, who is now at [redacted] weeks gestation,  being admitted for induction of labor, secondary to symptomatic achalasia.  The patient has been having ongoing vomiting after eating secondary to known  achalasia.  The patient was seen by her gastroenterologist, Dr. Arlyce Dice, who  injected Botox on February 29, 2004, however, her symptoms have persisted.  The patient has had good fetal movement, irregular contractions.  Her  prenatal course has also been noted for recurrent urinary tract infections,  for which she ultimately was placed on suppressive therapy.  Pren[facility] care  [facility] at [facility].   PRENATAL LABORATORY:  Blood type is B positive, antibody screen is negative.  Hemoglobin electrophoresis is normal.  RPR is nonreactive.  Rubella is  immune.  Hepatitis B surface antigen is negative.  GC and Chlamydia cultures  were negative.  AFP 3 screen was normal.  One hour glucose challenge test  was normal.  Group B Strep culture pending.   Anatomic fetal survey 19.3 weeks on November 01, 2003, was suboptimal, due to the  cardiac anatomy not fully seen and cord insertion not seen.  The study was  repeated on November 29, 2003, focusing on those particular areas and all areas  that had not been seen previously were seen and were normal.   ALLERGIES:  SULFA.   MEDICATIONS:  1.  Prenatal vitamins.  2.  Iron.  3.  Phenergan.   PAST MEDICAL HISTORY:  1.  Achalasia.  2.   Asthma.   PAST SURGICAL HISTORY:  Dilatation of her esophagus at age 66 and age 14.   OBSTETRIC HISTORY:  1.  Vaginal delivery at 36 weeks in March of 2003, 5 pound 14 ounce baby.  2.  TAB in October 2003.   FAMILY HISTORY:  Noncontributory.   SOCIAL HISTORY:  Consulting civil engineer at [employer].  One child.  Nonsmoker.   REVIEW OF SYSTEMS:  As per HPI.   PHYSICAL EXAMINATION:  Gravid black female in no acute distress.  VITAL SIGNS:  Afebrile, blood pressure 98/58, pulse 70.  SKIN:  No lesions.  HEENT:  Anicteric sclerae.  Pink conjunctivae.  Oropharynx negative.  HEART:  Regular rate and rhythm without murmur.  BREASTS:  Soft, nontender, no palpable mass.  BACK:  No CVA tenderness.  LUNGS:  Clear to auscultation.  ABDOMEN:  Gravid.  Fundal height 37 cm.  PELVIC:  1, 70. -2, vertex presentation.  EXTREMITIES:  Trace edema.   TRACINGS:  Baseline fetal heart rate of 140, accelerations, occasional  contractions.   IMPRESSION:  1.  Symptomatic achalasia.  2.  Term gestation.   PLAN:  1.  Admission.  2.  Routine admission labs.  3.  Low dose Pitocin.  4.  Analgesics p.r.n.  5.  Zofran p.r.n.  6.  Amniotomy p.r.n.  7.  Check Group B status.   IMPRESSION:      [place]/MEDQ  D:  03/11/2004  T:  03/11/2004  Job:  [id number]

## 2010-11-05 ENCOUNTER — Encounter: Payer: Self-pay | Admitting: Internal Medicine

## 2010-11-05 ENCOUNTER — Ambulatory Visit (INDEPENDENT_AMBULATORY_CARE_PROVIDER_SITE_OTHER): Payer: 59 | Admitting: Internal Medicine

## 2010-11-05 ENCOUNTER — Other Ambulatory Visit (INDEPENDENT_AMBULATORY_CARE_PROVIDER_SITE_OTHER): Payer: 59

## 2010-11-05 VITALS — BP 110/74 | HR 104 | Temp 97.8°F | Ht 67.0 in | Wt 238.0 lb

## 2010-11-05 DIAGNOSIS — F341 Dysthymic disorder: Secondary | ICD-10-CM

## 2010-11-05 DIAGNOSIS — M549 Dorsalgia, unspecified: Secondary | ICD-10-CM

## 2010-11-05 DIAGNOSIS — R5383 Other fatigue: Secondary | ICD-10-CM

## 2010-11-05 DIAGNOSIS — G4710 Hypersomnia, unspecified: Secondary | ICD-10-CM | POA: Insufficient documentation

## 2010-11-05 DIAGNOSIS — R5381 Other malaise: Secondary | ICD-10-CM

## 2010-11-05 DIAGNOSIS — R079 Chest pain, unspecified: Secondary | ICD-10-CM | POA: Insufficient documentation

## 2010-11-05 DIAGNOSIS — F32A Depression, unspecified: Secondary | ICD-10-CM

## 2010-11-05 DIAGNOSIS — F329 Major depressive disorder, single episode, unspecified: Secondary | ICD-10-CM | POA: Insufficient documentation

## 2010-11-05 DIAGNOSIS — Z0000 Encounter for general adult medical examination without abnormal findings: Secondary | ICD-10-CM | POA: Insufficient documentation

## 2010-11-05 LAB — HEPATIC FUNCTION PANEL
ALT: 19 U/L (ref 0–35)
AST: 18 U/L (ref 0–37)
Alkaline Phosphatase: 68 U/L (ref 39–117)
Bilirubin, Direct: 0.1 mg/dL (ref 0.0–0.3)
Total Protein: 7.7 g/dL (ref 6.0–8.3)

## 2010-11-05 LAB — CBC WITH DIFFERENTIAL/PLATELET
Basophils Relative: 0.2 % (ref 0.0–3.0)
Eosinophils Relative: 1.6 % (ref 0.0–5.0)
Lymphocytes Relative: 15.6 % (ref 12.0–46.0)
MCV: 81 fl (ref 78.0–100.0)
Monocytes Absolute: 0.5 10*3/uL (ref 0.1–1.0)
Neutrophils Relative %: 77.4 % — ABNORMAL HIGH (ref 43.0–77.0)
RBC: 4.21 Mil/uL (ref 3.87–5.11)
WBC: 9.2 10*3/uL (ref 4.5–10.5)

## 2010-11-05 LAB — BASIC METABOLIC PANEL
BUN: 14 mg/dL (ref 6–23)
Calcium: 8.8 mg/dL (ref 8.4–10.5)
Creatinine, Ser: 1 mg/dL (ref 0.4–1.2)
GFR: 85.24 mL/min (ref >60.00)

## 2010-11-05 MED ORDER — DULOXETINE HCL 30 MG PO CPEP: 30.0000 mg | ORAL_CAPSULE | Freq: Every day | ORAL | Status: DC

## 2010-11-05 MED ORDER — DULOXETINE HCL 60 MG PO CPEP: 60.0000 mg | ORAL_CAPSULE | Freq: Every day | ORAL | Status: DC

## 2010-11-05 MED ORDER — HYDROCHLOROTHIAZIDE 12.5 MG PO CAPS: 12.5000 mg | ORAL_CAPSULE | Freq: Every day | ORAL | Status: DC

## 2010-11-05 MED ORDER — VALACYCLOVIR HCL 500 MG PO TABS: 500.0000 mg | ORAL_TABLET | Freq: Every day | ORAL | Status: AC

## 2010-11-05 NOTE — Assessment & Plan Note (Signed)
Etiology unclear, Exam otherwise benign, to check labs as documented, follow with expectant management  

## 2010-11-05 NOTE — Assessment & Plan Note (Signed)
Mild to mod, for cymbatla asd, consider counseling but declines at this time,  to f/u any worsening symptoms or concerns

## 2010-11-05 NOTE — Assessment & Plan Note (Signed)
Daily moderate symptoms, can fall asleep at stop lights,  Will refer pulm for further eval and tx - ? OSA

## 2010-11-05 NOTE — Progress Notes (Signed)
  Subjective:    Patient ID: Lori Carpenter, female    DOB: 12-09-1980, 30 y.o.   MRN: [id number]  HPI  Here with several issues, has steadily gaine wt, now with ongoing fatigue and daytime hypersomnia most days, able to fall asleep at stoplights.  No AM headaches, but also with worsening depressive symptoms assoc with stress and anxiety, but no suicidal ideation, or panic.  Also with somatic symtpoms of diffuse tenderness to the upper and lower back, as well as upper chest, nonexertional and nonpleuritic, but often tender to touch.  Pt denies increased sob or doe, wheezing, orthopnea, PND, increased LE swelling, palpitations, dizziness or syncope. Pt denies new neurological symptoms such as new headache, or facial or extremity weakness or numbness   Pt denies polydipsia, polyuria,  Pt states overall good compliance with meds,  but little exercise however. Needs other med refills as well. Past Medical History  Diagnosis Date  . Abdominal pain, unspecified site 10/10/2009  . 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 ALLERGIC RHINITIS 08/19/2007  . ASTHMA 08/19/2007  . CONSTIPATION 10/29/2009  . DIZZINESS 02/13/2010  . GASTROENTERITIS 08/19/2007  . GERD 08/19/2007  . HEMATOCHEZIA 03/10/2008  . MYALGIA 10/10/2009  . OBESITY, MORBID 08/19/2007  . PERIPHERAL EDEMA 10/10/2009   Past Surgical History  Procedure Date  . Cholecystectomy   . Esophagogastroduodenoscopy 02/29/2004    reports that she has never smoked. She does not have any smokeless tobacco history on file. She reports that she does not drink alcohol or use illicit drugs. family history includes Diabetes in her other; Heart disease in her other; and Hypertension in her other. Allergies  Allergen Reactions  . Sulfonamide Derivatives    No current outpatient prescriptions on file prior to visit.           Review of Systems All otherwise neg per pt     Objective:   Physical Exam BP 110/74  Pulse 104  Temp(Src) 97.8 F (36.6 C) (Oral)  Ht 5\' 7"   (1.702 m)  Wt 238 lb (107.956 kg)  BMI 37.28 kg/m2  SpO2 97%  LMP 10/12/2010 Physical Exam  VS noted Constitutional: Pt appears well-developed and well-nourished.  HENT: Head: Normocephalic.  Right Ear: External ear normal.  Left Ear: External ear normal.  Eyes: Conjunctivae and EOM are normal. Pupils are equal, round, and reactive to light.  Neck: Normal range of motion. Neck supple.  Cardiovascular: Normal rate and regular rhythm.   Pulmonary/Chest: Effort normal and breath sounds normal.  Abd:  Soft, NT, non-distended, + BS Neurological: Pt is alert. No cranial nerve deficit. Motor/sens/dtr intact Skin: Skin is warm. No erythema.  Psychiatric: Pt behavior is normal. Thought content normal.  has depressed affectm, 1+ nervous Has diffuse nonspecific tender areas to upper and lower back and upper chest, but no swelling, rash, and spine nontender throughout       Assessment & Plan:

## 2010-11-05 NOTE — Patient Instructions (Addendum)
Take all new medications as prescribed - the cymbalta is started at 30 mg per day for 1 wk, THEN start 60 mg per day (two of the 30 mg's is ok to use up the first prescription (already sent to the pharmacy), and then start the second prescription  Please go to LAB in the Basement for the blood and/or urine tests to be done today Please go to XRAY in the Basement for the x-ray test Please call the phone number [phone] (the PhoneTree System) for results of testing in 2-3 days;  When calling, simply dial the number, and when prompted enter the MRN number above (the Medical Record Number) and the # key, then the message should start. Your EKG was good today You will be contacted regarding the referral for: pulmonary

## 2010-11-05 NOTE — Assessment & Plan Note (Addendum)
Unclear etiology, check cxr, and ecg today; but do not suspect CAD or need for ischemic eval

## 2010-11-05 NOTE — Assessment & Plan Note (Addendum)
?   FMS - tx with cymbalta as for the anxiety/depressoin,  to f/u any worsening symptoms or concerns

## 2010-12-12 ENCOUNTER — Ambulatory Visit: Payer: 59 | Admitting: Internal Medicine

## 2011-01-14 ENCOUNTER — Ambulatory Visit (INDEPENDENT_AMBULATORY_CARE_PROVIDER_SITE_OTHER): Payer: 59

## 2011-01-14 DIAGNOSIS — Z111 Encounter for screening for respiratory tuberculosis: Secondary | ICD-10-CM

## 2011-01-16 ENCOUNTER — Encounter: Payer: Self-pay | Admitting: Internal Medicine

## 2011-01-16 ENCOUNTER — Ambulatory Visit (INDEPENDENT_AMBULATORY_CARE_PROVIDER_SITE_OTHER): Payer: 59 | Admitting: Internal Medicine

## 2011-01-16 DIAGNOSIS — F329 Major depressive disorder, single episode, unspecified: Secondary | ICD-10-CM

## 2011-01-16 DIAGNOSIS — IMO0001 Reserved for inherently not codable concepts without codable children: Secondary | ICD-10-CM

## 2011-01-16 DIAGNOSIS — Z0000 Encounter for general adult medical examination without abnormal findings: Secondary | ICD-10-CM

## 2011-01-16 DIAGNOSIS — F341 Dysthymic disorder: Secondary | ICD-10-CM

## 2011-01-16 DIAGNOSIS — R609 Edema, unspecified: Secondary | ICD-10-CM

## 2011-01-16 DIAGNOSIS — F32A Depression, unspecified: Secondary | ICD-10-CM

## 2011-01-16 DIAGNOSIS — M797 Fibromyalgia: Secondary | ICD-10-CM

## 2011-01-16 HISTORY — DX: Fibromyalgia: M79.7

## 2011-01-16 LAB — TB SKIN TEST: TB Skin Test: NEGATIVE mm

## 2011-01-16 MED ORDER — TRAMADOL HCL 50 MG PO TABS: 50.0000 mg | ORAL_TABLET | Freq: Four times a day (QID) | ORAL | Status: AC | PRN

## 2011-01-16 MED ORDER — CYCLOBENZAPRINE HCL 5 MG PO TABS: 5.0000 mg | ORAL_TABLET | Freq: Three times a day (TID) | ORAL | Status: AC | PRN

## 2011-01-16 MED ORDER — DULOXETINE HCL 60 MG PO CPEP: 60.0000 mg | ORAL_CAPSULE | Freq: Every day | ORAL | Status: DC

## 2011-01-16 MED ORDER — HYDROCHLOROTHIAZIDE 25 MG PO TABS: 25.0000 mg | ORAL_TABLET | Freq: Every day | ORAL | Status: DC | PRN

## 2011-01-16 NOTE — Assessment & Plan Note (Signed)
Improved but with breakthrough pain - for tramadol/flexereil prn,  to f/u any worsening symptoms or concerns

## 2011-01-16 NOTE — Progress Notes (Signed)
  Subjective:    Patient ID: Lori Carpenter, female    DOB: 01-22-1981, 30 y.o.   MRN: [id number]  HPI  Here to f/u;  cymbalta has seemed to be effective with overall less pain, and improved anxiety/depressive symptoms, but still with occasional pain breakthrough symptoms, such as in the last 2 days, mostly pain to the upper torso area with dysethetic type discomfort, mult tender areas without fever, sweling, erythema.  Pt denies chest pain, increased sob or doe, wheezing, orthopnea, PND, increased LE swelling, palpitations, dizziness or syncope.  Pt denies new neurological symptoms such as new headache, or facial or extremity weakness or numbness   Pt denies polydipsia, polyuria. Denies worsening depressive symptoms, suicidal ideation, or panic, though has ongoing anxiety. Has also some pedal edema worse in the past few wks, better in the am, worse later in the day. Past Medical History  Diagnosis Date  . Abdominal pain, unspecified site 10/10/2009  . 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 ALLERGIC RHINITIS 08/19/2007  . ASTHMA 08/19/2007  . CONSTIPATION 10/29/2009  . DIZZINESS 02/13/2010  . GASTROENTERITIS 08/19/2007  . GERD 08/19/2007  . HEMATOCHEZIA 03/10/2008  . MYALGIA 10/10/2009  . OBESITY, MORBID 08/19/2007  . PERIPHERAL EDEMA 10/10/2009  . Fibromyalgia 01/16/2011   Past Surgical History  Procedure Date  . Cholecystectomy   . Esophagogastroduodenoscopy 02/29/2004    reports that she has never smoked. She does not have any smokeless tobacco history on file. She reports that she does not drink alcohol or use illicit drugs. family history includes Diabetes in her other; Heart disease in her other; and Hypertension in her other. Allergies  Allergen Reactions  . Sulfonamide Derivatives    No current outpatient prescriptions on file prior to visit.   Review of Systems Review of Systems  Constitutional: Negative for diaphoresis and unexpected weight change.  HENT: Negative for drooling and tinnitus.     Eyes: Negative for photophobia and visual disturbance.  Respiratory: Negative for choking and stridor.   Gastrointestinal: Negative for vomiting and blood in stool.  Genitourinary: Negative for hematuria and decreased urine volume.        Objective:   Physical Exam BP 100/72  Pulse 92  Temp(Src) 98.4 F (36.9 C) (Oral)  Ht 5\' 7"  (1.702 m)  Wt 237 lb 2 oz (107.559 kg)  BMI 37.14 kg/m2  SpO2 97% Physical Exam  VS noted Constitutional: Pt appears well-developed and well-nourished.  HENT: Head: Normocephalic.  Right Ear: External ear normal.  Left Ear: External ear normal.  Eyes: Conjunctivae and EOM are normal. Pupils are equal, round, and reactive to light.  Neck: Normal range of motion. Neck supple.  Cardiovascular: Normal rate and regular rhythm.   Pulmonary/Chest: Effort normal and breath sounds normal.  Abd:  Soft, NT, non-distended, + BS Neurological: Pt is alert. No cranial nerve deficit.  Skin: Skin is warm. No erythema. trace edema noted bilat pedal Psychiatric: Pt behavior is normal. Thought content normal. 1+ nervous Mult tender areas to upper chest and back    Assessment & Plan:

## 2011-01-16 NOTE — Assessment & Plan Note (Signed)
Mild persistent later in the day; to increase the hctz to 25mg  daily prn

## 2011-01-16 NOTE — Patient Instructions (Addendum)
Take all new medications as prescribed - the pain medication and muscle relaxer as needed for breakthrough pain Increase the fluid pill to 1/2 - 1 tab by mouth daily as needed for swelling Continue all other medications as before - the cymbalta Please return in 9 months for your yearly visit, or sooner if needed, with Lab testing done 3-5 days before

## 2011-01-16 NOTE — Assessment & Plan Note (Signed)
Improved, - stable overall by hx and exam, most recent data reviewed with pt, and pt to continue medical treatment as before  Lab Results  Component Value Date   WBC 9.2 11/05/2010   HGB 11.4* 11/05/2010   HCT 34.1* 11/05/2010   PLT 266.0 11/05/2010   ALT 19 11/05/2010   AST 18 11/05/2010   NA 137 11/05/2010   K 3.4* 11/05/2010   CL 101 11/05/2010   CREATININE 1.0 11/05/2010   BUN 14 11/05/2010   CO2 32 11/05/2010   TSH 1.05 11/05/2010

## 2011-10-07 ENCOUNTER — Emergency Department (HOSPITAL_COMMUNITY)
Admission: EM | Admit: 2011-10-07 | Discharge: 2011-10-07 | Disposition: A | Discharge status: Home / Self Care | Payer: 59 | Attending: Emergency Medicine | Admitting: Emergency Medicine

## 2011-10-07 ENCOUNTER — Encounter (HOSPITAL_COMMUNITY): Payer: Self-pay | Admitting: *Deleted

## 2011-10-07 DIAGNOSIS — K5900 Constipation, unspecified: Secondary | ICD-10-CM | POA: Insufficient documentation

## 2011-10-07 MED ORDER — BISACODYL 10 MG RE SUPP: 10.0000 mg | RECTAL | Status: AC | PRN

## 2011-10-07 MED ORDER — MAGNESIUM CITRATE PO SOLN: 296.0000 mL | Freq: Once | ORAL | Status: AC

## 2011-10-07 NOTE — ED Provider Notes (Signed)
History     CSN: [id number]  Arrival date & time 10/07/11  1478   First MD Initiated Contact with Patient 10/07/11 1910      Chief Complaint  Patient presents with  . Constipation    HPI Pt has not been able to have a normal bowel movement for the last week.  Pt has been trying medications without relief.  She has not spoken with her primary doctor.  No vomiting, diarrhea or fever.  She has been having abdominal cramping.  Pt has had trouble with constipation in the past.  Nothing seems to help.  The symptoms are getting. Past Medical History  Diagnosis Date  . Abdominal pain, unspecified site 10/10/2009  . 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 ALLERGIC RHINITIS 08/19/2007  . ASTHMA 08/19/2007  . CONSTIPATION 10/29/2009  . DIZZINESS 02/13/2010  . GASTROENTERITIS 08/19/2007  . GERD 08/19/2007  . HEMATOCHEZIA 03/10/2008  . MYALGIA 10/10/2009  . OBESITY, MORBID 08/19/2007  . PERIPHERAL EDEMA 10/10/2009  . Fibromyalgia 01/16/2011    Past Surgical History  Procedure Date  . Cholecystectomy   . Esophagogastroduodenoscopy 02/29/2004    Family History  Problem Relation Age of Onset  . Diabetes Other   . Hypertension Other   . Heart disease Other     History  Substance Use Topics  . Smoking status: Never Smoker   . Smokeless tobacco: Not on file  . Alcohol Use: No    OB History    Grav Para Term Preterm Abortions TAB SAB Ect Mult Living                  Review of Systems  All other systems reviewed and are negative.    Allergies  Sulfonamide derivatives  Home Medications   Current Outpatient Rx  Name Route Sig Dispense Refill  . DULOXETINE HCL 60 MG PO CPEP Oral Take 1 capsule (60 mg total) by mouth daily. 30 capsule 11  . HYDROCHLOROTHIAZIDE 25 MG PO TABS Oral Take 1 tablet (25 mg total) by mouth daily as needed. 90 tablet 3    BP 116/85  Pulse 112  Temp 98 F (36.7 C)  Resp 22  SpO2 100%  LMP 09/07/2011  Physical Exam  Nursing note and vitals reviewed. Constitutional:  She appears well-developed and well-nourished. No distress.  HENT:  Head: Normocephalic and atraumatic.  Right Ear: External ear normal.  Left Ear: External ear normal.  Eyes: Conjunctivae are normal. Right eye exhibits no discharge. Left eye exhibits no discharge. No scleral icterus.  Neck: Neck supple. No tracheal deviation present.  Cardiovascular: Normal rate, regular rhythm and intact distal pulses.   Pulmonary/Chest: Effort normal and breath sounds normal. No stridor. No respiratory distress. She has no wheezes. She has no rales.  Abdominal: Soft. Bowel sounds are normal. She exhibits no distension. There is no tenderness. There is no rebound and no guarding.  Genitourinary: Rectal exam shows no mass, no tenderness and anal tone normal.       No impaction, small amount of brown stool  Musculoskeletal: She exhibits no edema and no tenderness.  Neurological: She is alert. She has normal strength. No sensory deficit. Cranial nerve deficit:  no gross defecits noted. She exhibits normal muscle tone. She displays no seizure activity. Coordination normal.  Skin: Skin is warm and dry. No rash noted.  Psychiatric: She has a normal mood and affect.    ED Course  Procedures (including critical care time)  Labs Reviewed - No data to display No results  found.   1. Constipation       MDM  Pt given an enema in the ED with some relief.  Will dc home on mag citrate.  Suggested follow up with PCP, consider GI consultation considering the recurrent episodes.        Celene Kras, MD 10/08/11 [phone]

## 2011-10-07 NOTE — ED Notes (Signed)
Pt states she has not been able to have a bowel movement in 1 week. Pt states she has taken a lot of medication and nothing has worked. Pt denies any n/v. Pt states she has abdominal pain that comes and goes

## 2011-10-07 NOTE — Discharge Instructions (Signed)

## 2012-02-17 ENCOUNTER — Telehealth: Payer: Self-pay | Admitting: Internal Medicine

## 2012-02-17 MED ORDER — HYDROCHLOROTHIAZIDE 25 MG PO TABS: 25.0000 mg | ORAL_TABLET | Freq: Every day | ORAL | Status: DC

## 2012-02-17 MED ORDER — DULOXETINE HCL 60 MG PO CPEP: 60.0000 mg | ORAL_CAPSULE | Freq: Every day | ORAL | Status: DC

## 2012-02-17 NOTE — Telephone Encounter (Signed)
Routine Refills per OV policy ok with me

## 2012-02-17 NOTE — Telephone Encounter (Signed)
Patient  had stopped taking Cymbalta and "fluid pill" (Hydrodiruil)   in November/ 2012  because she felt better from Fibromyalgia and had lost Insurance coverage and could not afford cost.   02/17/2012 Patient states she has not been outside as much lately  and with the cooler weather she is  now aching more in her neck, back and arms and retaining more fluid in her ankles.  Patient states she  has been watching her diet .  Afebrile.   Patient called Pharmacy and was told prescription has expired.    Patient still without coverage and is requesting if Cymbalta and "fluid pill" can be refilled.   All emergent symptoms ruled out per Muscle Pain Protocol  with exception to 'Impairs ability to perform activities of daily living (ADLs)'.  Home Care Advice given.   Patient encouraged to be evaluated if symptoms do not improve.  Patient concerned she cannot afford visit without coverage.  Patient also asking if diuretic dose could be increased but RN did advice she may need to be re-evaluated for a dosage change.  Patient aware.  Can office please review this prescription refill request?   Patient Pharmacy is  [facility] on [address]  [phone]

## 2012-04-17 ENCOUNTER — Emergency Department (HOSPITAL_BASED_OUTPATIENT_CLINIC_OR_DEPARTMENT_OTHER)
Admission: EM | Admit: 2012-04-17 | Discharge: 2012-04-17 | Disposition: A | Discharge status: Home / Self Care | Payer: Self-pay | Attending: Emergency Medicine | Admitting: Emergency Medicine

## 2012-04-17 ENCOUNTER — Encounter (HOSPITAL_BASED_OUTPATIENT_CLINIC_OR_DEPARTMENT_OTHER): Payer: Self-pay | Admitting: *Deleted

## 2012-04-17 DIAGNOSIS — N390 Urinary tract infection, site not specified: Secondary | ICD-10-CM | POA: Insufficient documentation

## 2012-04-17 DIAGNOSIS — Z8739 Personal history of other diseases of the musculoskeletal system and connective tissue: Secondary | ICD-10-CM | POA: Insufficient documentation

## 2012-04-17 DIAGNOSIS — B379 Candidiasis, unspecified: Secondary | ICD-10-CM | POA: Insufficient documentation

## 2012-04-17 DIAGNOSIS — Z8719 Personal history of other diseases of the digestive system: Secondary | ICD-10-CM | POA: Insufficient documentation

## 2012-04-17 DIAGNOSIS — J45909 Unspecified asthma, uncomplicated: Secondary | ICD-10-CM | POA: Insufficient documentation

## 2012-04-17 LAB — URINALYSIS, ROUTINE W REFLEX MICROSCOPIC
Glucose, UA: NEGATIVE mg/dL
Hgb urine dipstick: NEGATIVE
Ketones, ur: NEGATIVE mg/dL
pH: 6 (ref 5.0–8.0)

## 2012-04-17 LAB — URINE MICROSCOPIC-ADD ON

## 2012-04-17 LAB — PREGNANCY, URINE: Preg Test, Ur: NEGATIVE

## 2012-04-17 MED ORDER — FLUCONAZOLE 50 MG PO TABS
150.0000 mg | ORAL_TABLET | Freq: Once | ORAL | Status: AC
  Administered 2012-04-17: 150 mg via ORAL
  Filled 2012-04-17: qty 1

## 2012-04-17 MED ORDER — CIPROFLOXACIN HCL 500 MG PO TABS: 500.0000 mg | ORAL_TABLET | Freq: Two times a day (BID) | ORAL | Status: DC

## 2012-04-17 MED ORDER — FLUCONAZOLE 150 MG PO TABS: 150.0000 mg | ORAL_TABLET | Freq: Once | ORAL | Status: DC

## 2012-04-17 NOTE — ED Provider Notes (Signed)
Medical screening examination/treatment/procedure(s) were performed by non-physician practitioner and as supervising physician I was immediately available for consultation/collaboration.    Nelia Shi, MD 04/17/12 [phone]

## 2012-04-17 NOTE — ED Provider Notes (Signed)
History     CSN: [id number]  Arrival date & time 04/17/12  1248   First MD Initiated Contact with Patient 04/17/12 1300      Chief Complaint  Patient presents with  . Vaginal Discharge    (Consider location/radiation/quality/duration/timing/severity/associated sxs/prior treatment) HPI History provided by pt.   Pt has had vulvar pruritis and brown vaginal discharge since yesterday evening.  Concerned she may have a yeast infection.  Has had several in the past that are refractory to OTC medications.  No recent abx use.  No associated fever, abd pain, N/V, dysuria or other vaginal sx.  Urinates frequently but attributes to hctz.  H/o herpes.   Past Medical History  Diagnosis Date  . Abdominal pain, unspecified site 10/10/2009  . 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 ALLERGIC RHINITIS 08/19/2007  . ASTHMA 08/19/2007  . CONSTIPATION 10/29/2009  . DIZZINESS 02/13/2010  . GASTROENTERITIS 08/19/2007  . GERD 08/19/2007  . HEMATOCHEZIA 03/10/2008  . MYALGIA 10/10/2009  . OBESITY, MORBID 08/19/2007  . PERIPHERAL EDEMA 10/10/2009  . Fibromyalgia 01/16/2011    Past Surgical History  Procedure Date  . Cholecystectomy   . Esophagogastroduodenoscopy 02/29/2004    Family History  Problem Relation Age of Onset  . Diabetes Other   . Hypertension Other   . Heart disease Other     History  Substance Use Topics  . Smoking status: Never Smoker   . Smokeless tobacco: Not on file  . Alcohol Use: No    OB History    Grav Para Term Preterm Abortions TAB SAB Ect Mult Living                  Review of Systems  All other systems reviewed and are negative.    Allergies  Sulfonamide derivatives  Home Medications   Current Outpatient Rx  Name  Route  Sig  Dispense  Refill  . DULOXETINE HCL 60 MG PO CPEP   Oral   Take 1 capsule (60 mg total) by mouth daily.   30 capsule   3   . HYDROCHLOROTHIAZIDE 25 MG PO TABS   Oral   Take 1 tablet (25 mg total) by mouth daily.   30 tablet   1   .  HYDROCHLOROTHIAZIDE 25 MG PO TABS   Oral   Take 1 tablet (25 mg total) by mouth daily as needed.   90 tablet   3     BP 126/80  Pulse 83  Temp 98.2 F (36.8 C) (Oral)  Resp 16  SpO2 99%  Physical Exam  Nursing note and vitals reviewed. Constitutional: She is oriented to person, place, and time. She appears well-developed and well-nourished. No distress.  HENT:  Head: Normocephalic and atraumatic.  Eyes:       Normal appearance  Neck: Normal range of motion.  Cardiovascular: Normal rate and regular rhythm.   Pulmonary/Chest: Effort normal and breath sounds normal. No respiratory distress.  Abdominal: Soft. Bowel sounds are normal. She exhibits no distension and no mass. There is no tenderness. There is no rebound and no guarding.  Genitourinary:       Nml external genitalia.  Physiologic vaginal discharge.  No vaginal bleeding.  Cervix closed and appears nml.  No adnexal or cervical motion tenderess.  R CVA tenderness  Musculoskeletal: Normal range of motion.  Neurological: She is alert and oriented to person, place, and time.  Skin: Skin is warm and dry. No rash noted.  Psychiatric: She has a normal mood and affect. Her  behavior is normal.    ED Course  Procedures (including critical care time)  Labs Reviewed  WET PREP, GENITAL - Abnormal; Notable for the following:    Yeast Wet Prep HPF POC FEW (*)     Clue Cells Wet Prep HPF POC FEW (*)     WBC, Wet Prep HPF POC FEW (*)     All other components within normal limits  URINALYSIS, ROUTINE W REFLEX MICROSCOPIC - Abnormal; Notable for the following:    Nitrite POSITIVE (*)     Leukocytes, UA SMALL (*)     All other components within normal limits  URINE MICROSCOPIC-ADD ON - Abnormal; Notable for the following:    Squamous Epithelial / LPF FEW (*)     Bacteria, UA MANY (*)     All other components within normal limits  PREGNANCY, URINE  GC/CHLAMYDIA PROBE AMP  URINE CULTURE   No results found.   1. Urinary tract  infection   2. Yeast infection       MDM  31yo F presents w/ vaginal discharge and vulvar pruritis.  Has had vaginal candidiasis that presented similarly in past.  Afebrile, unremarkable genitalia and abd benign on exam.  Wet prep, U/A and urine preg pending.  1:15 PM   Wet prep shows few yeast.  Will treat because symptomatic.  U/A pos for bacteruria and nitrates.  Prescribed cipro in case of mild pyelo (R CVA ttp).  Return precautions discussed. 2:21 PM        Otilio Miu, [place] 04/17/12 1422

## 2012-04-17 NOTE — ED Notes (Signed)
Patient with c/o vaginal discharge.  States that the symptoms feel like yeast infection.  She has had yeast infection in the past and this feels just like it.  No other symptoms

## 2012-04-19 LAB — GC/CHLAMYDIA PROBE AMP: GC Probe RNA: NEGATIVE

## 2012-04-20 LAB — URINE CULTURE

## 2012-04-21 NOTE — ED Notes (Signed)
+   Urine Patient treated with Cipro-sensitive to same-chart appended per protocol MD. 

## 2012-06-26 LAB — HM PAP SMEAR

## 2012-09-20 ENCOUNTER — Telehealth: Payer: Self-pay | Admitting: Gastroenterology

## 2012-09-20 NOTE — Telephone Encounter (Signed)
Pt states she is having problems with her throat, she sings and thinks she may need to have someone look at her vocal cords. Pt states it is not her esophagus. Explained to pt she should probably see her PCP and let them decide if she needs to be seen by Dr. Arlyce Dice or by an ENT. Pt verbalized understanding.

## 2012-10-14 ENCOUNTER — Encounter (INDEPENDENT_AMBULATORY_CARE_PROVIDER_SITE_OTHER): Payer: 59

## 2012-10-14 ENCOUNTER — Telehealth: Payer: Self-pay | Admitting: Internal Medicine

## 2012-10-14 ENCOUNTER — Encounter: Payer: Self-pay | Admitting: Internal Medicine

## 2012-10-14 ENCOUNTER — Ambulatory Visit (INDEPENDENT_AMBULATORY_CARE_PROVIDER_SITE_OTHER): Payer: 59 | Admitting: Internal Medicine

## 2012-10-14 ENCOUNTER — Ambulatory Visit: Payer: PRIVATE HEALTH INSURANCE | Admitting: Internal Medicine

## 2012-10-14 VITALS — BP 110/84 | HR 67 | Temp 97.6°F | Ht 67.0 in | Wt 231.1 lb

## 2012-10-14 DIAGNOSIS — M79609 Pain in unspecified limb: Secondary | ICD-10-CM

## 2012-10-14 DIAGNOSIS — M7989 Other specified soft tissue disorders: Secondary | ICD-10-CM | POA: Insufficient documentation

## 2012-10-14 DIAGNOSIS — F32A Depression, unspecified: Secondary | ICD-10-CM

## 2012-10-14 DIAGNOSIS — M25579 Pain in unspecified ankle and joints of unspecified foot: Secondary | ICD-10-CM

## 2012-10-14 DIAGNOSIS — M79605 Pain in left leg: Secondary | ICD-10-CM | POA: Insufficient documentation

## 2012-10-14 DIAGNOSIS — F419 Anxiety disorder, unspecified: Secondary | ICD-10-CM

## 2012-10-14 DIAGNOSIS — F341 Dysthymic disorder: Secondary | ICD-10-CM

## 2012-10-14 MED ORDER — NAPROXEN 500 MG PO TABS: 500.0000 mg | ORAL_TABLET | Freq: Two times a day (BID) | ORAL | Status: DC

## 2012-10-14 NOTE — Telephone Encounter (Signed)
Robin to let pt know - no DVT by venou doppler u/s, so we will refer to orthopedic as planned for ? Tendonitis or ankle problem

## 2012-10-14 NOTE — Assessment & Plan Note (Signed)
stable overall by history and exam, recent data reviewed with pt, and pt to continue medical treatment as before,  to f/u any worsening symptoms or concerns Lab Results  Component Value Date   WBC 9.2 11/05/2010   HGB 11.4* 11/05/2010   HCT 34.1* 11/05/2010   PLT 266.0 11/05/2010   GLUCOSE 85 11/05/2010   ALT 19 11/05/2010   AST 18 11/05/2010   NA 137 11/05/2010   K 3.4* 11/05/2010   CL 101 11/05/2010   CREATININE 1.0 11/05/2010   BUN 14 11/05/2010   CO2 32 11/05/2010   TSH 1.05 11/05/2010

## 2012-10-14 NOTE — Assessment & Plan Note (Signed)
Whole leg but primarily medial left ankle and midfoot; for nsaid, refer ortho (if no DVT)

## 2012-10-14 NOTE — Telephone Encounter (Signed)
Message copied by Corwin Levins on Thu Oct 14, 2012  4:54 PM ------      Message from: Richardean Canal      Created: Thu Oct 14, 2012  4:51 PM      Regarding: prelim       Left lower extremity appears negative for acute DVT.      Thank you ------

## 2012-10-14 NOTE — Assessment & Plan Note (Signed)
With pain and swelling, wt gain - to check LLE venous doppler

## 2012-10-14 NOTE — Patient Instructions (Signed)
Please see our [facility] now to have the Left Leg Venous Doppler done asap Please take all new medication as prescribed - the naproxen Please continue all other medications as before You will be contacted regarding the referral for: orthopedic, and bariatric surgury at your request

## 2012-10-14 NOTE — Progress Notes (Signed)
Subjective:    Patient ID: Lori Carpenter, female    DOB: 07/21/1980, 32 y.o.   MRN: [id number]  HPI  Here to f/u, has hx of periph edema, but has gained 9 lbs in the past 2 wks, and now whole leg swollen to above the knee, also with unusual pain to distal LLE from medial heel to a few cm above the left ankle medially.  Pt denies fever, wt loss, night sweats, loss of appetite, or other constitutional symptoms No hx of DVT.  Pt denies chest pain, increased sob or doe, wheezing, orthopnea, PND, increased LE swelling, palpitations, dizziness or syncope.  Did play paintball last Sunday but pain and swelling occurred prior. No falls or trauma.  Pt denies new neurological symptoms such as new headache, or facial or extremity weakness or numbness. Denies worsening depressive symptoms, suicidal ideation, or panic; has ongoing anxiety. Also unable to lose wt, asks for bariatric surgury referral to consider lap band Past Medical History  Diagnosis Date  . Abdominal pain, unspecified site 10/10/2009  . 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 ALLERGIC RHINITIS 08/19/2007  . ASTHMA 08/19/2007  . CONSTIPATION 10/29/2009  . DIZZINESS 02/13/2010  . GASTROENTERITIS 08/19/2007  . GERD 08/19/2007  . HEMATOCHEZIA 03/10/2008  . MYALGIA 10/10/2009  . OBESITY, MORBID 08/19/2007  . PERIPHERAL EDEMA 10/10/2009  . Fibromyalgia 01/16/2011   Past Surgical History  Procedure Laterality Date  . Cholecystectomy    . Esophagogastroduodenoscopy  02/29/2004    reports that she has never smoked. She does not have any smokeless tobacco history on file. She reports that she does not drink alcohol or use illicit drugs. family history includes Diabetes in her other; Heart disease in her other; and Hypertension in her other. Allergies  Allergen Reactions  . Sulfonamide Derivatives Anaphylaxis   Current Outpatient Prescriptions on File Prior to Visit  Medication Sig Dispense Refill  . hydrochlorothiazide (HYDRODIURIL) 25 MG tablet Take 1 tablet (25  mg total) by mouth daily.  30 tablet  1  . hydrochlorothiazide 25 MG tablet Take 1 tablet (25 mg total) by mouth daily as needed.  90 tablet  3   No current facility-administered medications on file prior to visit.   Review of Systems  Constitutional: Negative for unexpected weight change, or unusual diaphoresis  HENT: Negative for tinnitus.   Eyes: Negative for photophobia and visual disturbance.  Respiratory: Negative for choking and stridor.   Gastrointestinal: Negative for vomiting and blood in stool.  Genitourinary: Negative for hematuria and decreased urine volume.  Musculoskeletal: Negative for acute joint swelling Skin: Negative for color change and wound.  Neurological: Negative for tremors and numbness other than noted  Psychiatric/Behavioral: Negative for decreased concentration or  hyperactivity.        Objective:   Physical Exam BP 110/84  Pulse 67  Temp(Src) 97.6 F (36.4 C) (Oral)  Ht 5\' 7"  (1.702 m)  Wt 231 lb 2 oz (104.838 kg)  BMI 36.19 kg/m2  SpO2 96% VS noted, not ill appearing Constitutional: Pt appears well-developed and well-nourished.  HENT: Head: NCAT.  Right Ear: External ear normal.  Left Ear: External ear normal.  Eyes: Conjunctivae and EOM are normal. Pupils are equal, round, and reactive to light.  Neck: Normal range of motion. Neck supple.  Cardiovascular: Normal rate and regular rhythm.   Pulmonary/Chest: Effort normal and breath sounds normal.  Abd:  Soft, NT, non-distended, + BS Neurological: Pt is alert. Not confused  Skin: with LLE 1-2+ edema to above left knee, no  ulcer but has tender to left medial ankle along the tarsal tunnel Psychiatric: Pt behavior is normal. Thought content normal.     Assessment & Plan:

## 2012-10-15 NOTE — Telephone Encounter (Signed)
Called left message to call back 

## 2012-10-15 NOTE — Telephone Encounter (Signed)
Patient informed of results.  

## 2012-12-14 ENCOUNTER — Ambulatory Visit (INDEPENDENT_AMBULATORY_CARE_PROVIDER_SITE_OTHER): Payer: 59 | Admitting: Internal Medicine

## 2012-12-14 ENCOUNTER — Other Ambulatory Visit (INDEPENDENT_AMBULATORY_CARE_PROVIDER_SITE_OTHER): Payer: 59

## 2012-12-14 ENCOUNTER — Encounter: Payer: Self-pay | Admitting: Internal Medicine

## 2012-12-14 VITALS — BP 120/80 | HR 79 | Temp 97.3°F | Ht 67.0 in | Wt 235.4 lb

## 2012-12-14 DIAGNOSIS — R609 Edema, unspecified: Secondary | ICD-10-CM

## 2012-12-14 DIAGNOSIS — IMO0001 Reserved for inherently not codable concepts without codable children: Secondary | ICD-10-CM

## 2012-12-14 DIAGNOSIS — M797 Fibromyalgia: Secondary | ICD-10-CM

## 2012-12-14 LAB — URINALYSIS, ROUTINE W REFLEX MICROSCOPIC
Bilirubin Urine: NEGATIVE
Nitrite: NEGATIVE
Total Protein, Urine: NEGATIVE
pH: 8 (ref 5.0–8.0)

## 2012-12-14 LAB — CBC WITH DIFFERENTIAL/PLATELET
Eosinophils Relative: 2.1 % (ref 0.0–5.0)
HCT: 36.6 % (ref 36.0–46.0)
Hemoglobin: 12 g/dL (ref 12.0–15.0)
Lymphs Abs: 1.3 10*3/uL (ref 0.7–4.0)
Monocytes Relative: 7.6 % (ref 3.0–12.0)
Neutro Abs: 4 10*3/uL (ref 1.4–7.7)
Platelets: 253 10*3/uL (ref 150.0–400.0)
RBC: 4.46 Mil/uL (ref 3.87–5.11)
WBC: 5.9 10*3/uL (ref 4.5–10.5)

## 2012-12-14 MED ORDER — FUROSEMIDE 20 MG PO TABS: 20.0000 mg | ORAL_TABLET | Freq: Every day | ORAL | Status: DC

## 2012-12-14 MED ORDER — LORCASERIN HCL 10 MG PO TABS: 1.0000 | ORAL_TABLET | Freq: Two times a day (BID) | ORAL | Status: DC

## 2012-12-14 NOTE — Progress Notes (Signed)
Subjective:    Patient ID: Lori Carpenter, female    DOB: 04-04-1981, 32 y.o.   MRN: [id number]  HPI  Here after seeing podiatry with boot for 6 wks, but c/o persistent wt gain and edema despite HCT, shoe change.  Does take in quite a bit of fluid during the day, but says she only drinks when thirsty. Wt is up and down, more up several lbs recently, as well as now persistent leg edema.  Due for refill cymbalta.  Asks for Belviq to help lose wt.  Pt denies chest pain, increased sob or doe, wheezing, orthopnea, PND, palpitations, dizziness or syncope.   Pt denies polydipsia, polyuria,.  Pt states overall good compliance with meds, but little exercise.  Denies worsening depressive symptoms, suicidal ideation, or panic Past Medical History  Diagnosis Date  . Abdominal pain, unspecified site 10/10/2009  . 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 ALLERGIC RHINITIS 08/19/2007  . ASTHMA 08/19/2007  . CONSTIPATION 10/29/2009  . DIZZINESS 02/13/2010  . GASTROENTERITIS 08/19/2007  . GERD 08/19/2007  . HEMATOCHEZIA 03/10/2008  . MYALGIA 10/10/2009  . OBESITY, MORBID 08/19/2007  . PERIPHERAL EDEMA 10/10/2009  . Fibromyalgia 01/16/2011   Past Surgical History  Procedure Laterality Date  . Cholecystectomy    . Esophagogastroduodenoscopy  02/29/2004    reports that she has never smoked. She does not have any smokeless tobacco history on file. She reports that she does not drink alcohol or use illicit drugs. family history includes Diabetes in her other; Heart disease in her other; and Hypertension in her other. Allergies  Allergen Reactions  . Sulfonamide Derivatives Anaphylaxis   Current Outpatient Prescriptions on File Prior to Visit  Medication Sig Dispense Refill  . naproxen (NAPROSYN) 500 MG tablet Take 1 tablet (500 mg total) by mouth 2 (two) times daily with a meal. As needed for pain  60 tablet  2  . hydrochlorothiazide 25 MG tablet Take 1 tablet (25 mg total) by mouth daily as needed.  90 tablet  3   No current  facility-administered medications on file prior to visit.   Review of Systems  Constitutional: Negative for unexpected weight change, or unusual diaphoresis  HENT: Negative for tinnitus.   Eyes: Negative for photophobia and visual disturbance.  Respiratory: Negative for choking and stridor.   Gastrointestinal: Negative for vomiting and blood in stool.  Genitourinary: Negative for hematuria and decreased urine volume.  Musculoskeletal: Negative for acute joint swelling Skin: Negative for color change and wound.  Neurological: Negative for tremors and numbness other than noted  Psychiatric/Behavioral: Negative for decreased concentration or  hyperactivity.       Objective:   Physical Exam BP 120/80  Pulse 79  Temp(Src) 97.3 F (36.3 C) (Oral)  Ht 5\' 7"  (1.702 m)  Wt 235 lb 6 oz (106.765 kg)  BMI 36.86 kg/m2  SpO2 98% VS noted,  Constitutional: Pt appears well-developed and well-nourished. /morbid obese HENT: Head: NCAT.  Right Ear: External ear normal.  Left Ear: External ear normal.  Eyes: Conjunctivae and EOM are normal. Pupils are equal, round, and reactive to light.  Neck: Normal range of motion. Neck supple.  Cardiovascular: Normal rate and regular rhythm.   Pulmonary/Chest: Effort normal and breath sounds normal.  Abd:  Soft, NT, non-distended, + BS Neurological: Pt is alert. Not confused , motor 5/5 Skin: Skin is warm. No erythema. trace to 1+ swelling to mid leg bilat , left > right Psychiatric: Pt behavior is normal. Thought content normal.     Assessment &  Plan:

## 2012-12-14 NOTE — Assessment & Plan Note (Addendum)
ECG reviewed as per emr - sinus without acute changes, etiology unclear except suspect element venous insufficiency; for wt loss, compression stockings. Leg elevation, dont push oral fluids to lose wt, low salt diet, change hct to lasix 20 qd, routine labs today - r/o such as renal insuff, proteinuria, anemia, low thyroid  Note:  Total time for pt hx, exam, review of record with pt in the room, determination of diagnoses and plan for further eval and tx is > 40 min, with over 50% spent in coordination and counseling of patient

## 2012-12-14 NOTE — Assessment & Plan Note (Signed)
Stablem, for cymbalta refill

## 2012-12-14 NOTE — Assessment & Plan Note (Signed)
Ok for belviq trial

## 2012-12-14 NOTE — Patient Instructions (Signed)
Please take all new medication as prescribed - the belviq and the lasix Please continue all other medications as before, and refills have been done if requested - the cymbalta OK to stop the HCTZ Your EKG was OK Please go to the XRAY Department in the Basement (go straight as you get off the elevator) for the x-ray testing Please go to the LAB in the Basement (turn left off the elevator) for the tests to be done today Please consider compression stocking for the legs (knee high), avoid salt, and leg elevation if sitting down  Please remember to sign up for My Chart if you have not done so, as this will be important to you in the future with finding out test results, communicating by private email, and scheduling acute appointments online when needed.

## 2012-12-15 ENCOUNTER — Encounter: Payer: Self-pay | Admitting: Internal Medicine

## 2012-12-15 LAB — BASIC METABOLIC PANEL
BUN: 12 mg/dL (ref 6–23)
Calcium: 9.1 mg/dL (ref 8.4–10.5)
Creatinine, Ser: 0.8 mg/dL (ref 0.4–1.2)
GFR: 105.58 mL/min (ref >60.00)
Glucose, Bld: 89 mg/dL (ref 70–99)

## 2012-12-15 LAB — TSH: TSH: 1.75 u[IU]/mL (ref 0.35–5.50)

## 2012-12-15 LAB — LIPID PANEL
Cholesterol: 162 mg/dL (ref 0–200)
HDL: 64 mg/dL (ref >39.00)
VLDL: 13 mg/dL (ref 0.0–40.0)

## 2012-12-15 LAB — HEPATIC FUNCTION PANEL: Albumin: 3.5 g/dL (ref 3.5–5.2)

## 2013-03-31 ENCOUNTER — Other Ambulatory Visit: Payer: Self-pay | Admitting: *Deleted

## 2013-03-31 MED ORDER — FUROSEMIDE 20 MG PO TABS: 20.0000 mg | ORAL_TABLET | Freq: Every day | ORAL | Status: DC

## 2013-04-28 ENCOUNTER — Telehealth: Payer: Self-pay | Admitting: *Deleted

## 2013-04-28 MED ORDER — DULOXETINE HCL 60 MG PO CPEP: 60.0000 mg | ORAL_CAPSULE | Freq: Every day | ORAL | Status: DC

## 2013-04-28 NOTE — Telephone Encounter (Signed)
Done erx 

## 2013-04-28 NOTE — Telephone Encounter (Signed)
Pt called requesting Cymbalta refill be called into [facility] at [facility].  Please advise

## 2013-04-29 ENCOUNTER — Other Ambulatory Visit: Payer: Self-pay | Admitting: Internal Medicine

## 2013-07-14 ENCOUNTER — Other Ambulatory Visit: Payer: Self-pay | Admitting: Internal Medicine

## 2013-07-19 ENCOUNTER — Ambulatory Visit: Payer: 59 | Admitting: Internal Medicine

## 2013-07-20 ENCOUNTER — Ambulatory Visit (INDEPENDENT_AMBULATORY_CARE_PROVIDER_SITE_OTHER): Payer: BC Managed Care – PPO | Admitting: Internal Medicine

## 2013-07-20 ENCOUNTER — Encounter: Payer: Self-pay | Admitting: Internal Medicine

## 2013-07-20 VITALS — BP 110/78 | HR 95 | Temp 98.1°F | Ht 67.0 in | Wt 236.0 lb

## 2013-07-20 DIAGNOSIS — R609 Edema, unspecified: Secondary | ICD-10-CM

## 2013-07-20 DIAGNOSIS — F341 Dysthymic disorder: Secondary | ICD-10-CM

## 2013-07-20 DIAGNOSIS — M797 Fibromyalgia: Secondary | ICD-10-CM

## 2013-07-20 DIAGNOSIS — F329 Major depressive disorder, single episode, unspecified: Secondary | ICD-10-CM

## 2013-07-20 DIAGNOSIS — IMO0001 Reserved for inherently not codable concepts without codable children: Secondary | ICD-10-CM

## 2013-07-20 DIAGNOSIS — F32A Depression, unspecified: Secondary | ICD-10-CM

## 2013-07-20 DIAGNOSIS — F419 Anxiety disorder, unspecified: Secondary | ICD-10-CM

## 2013-07-20 MED ORDER — CYCLOBENZAPRINE HCL 5 MG PO TABS: 5.0000 mg | ORAL_TABLET | Freq: Three times a day (TID) | ORAL | Status: DC | PRN

## 2013-07-20 MED ORDER — PHENTERMINE HCL 37.5 MG PO CAPS: 37.5000 mg | ORAL_CAPSULE | ORAL | Status: DC

## 2013-07-20 MED ORDER — TRAMADOL HCL 50 MG PO TABS: 50.0000 mg | ORAL_TABLET | Freq: Three times a day (TID) | ORAL | Status: DC | PRN

## 2013-07-20 NOTE — Progress Notes (Signed)
Pre visit review using our clinic review tool, if applicable. No additional management support is needed unless otherwise documented below in the visit note. 

## 2013-07-20 NOTE — Progress Notes (Signed)
Subjective:    Patient ID: Lori Carpenter, female    DOB: May 14, 1981, 33 y.o.   MRN: [id number]  HPI  Here to f/u, c/o flare of FMS like upper and lower back pain with severe fatigue 3 days ago, pain about 9/10, no 7/10 but still hard to function, despite cymbalta use.  Has been unable to lose wt. Does c/o ongoing fatigue, but denies signficant daytime hypersomnolence.  Edema controlled with prn lasix.  Pt denies chest pain, increased sob or doe, wheezing, orthopnea, PND, increased LE swelling, palpitations, dizziness or syncope.   Pt denies new neurological symptoms such as new headache, or facial or extremity weakness or numbness   Pt denies polydipsia, polyuria. 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  Can be overwhelming. Past Medical History  Diagnosis Date  . Abdominal pain, unspecified site 10/10/2009  . 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 ALLERGIC RHINITIS 08/19/2007  . ASTHMA 08/19/2007  . CONSTIPATION 10/29/2009  . DIZZINESS 02/13/2010  . GASTROENTERITIS 08/19/2007  . GERD 08/19/2007  . HEMATOCHEZIA 03/10/2008  . MYALGIA 10/10/2009  . OBESITY, MORBID 08/19/2007  . PERIPHERAL EDEMA 10/10/2009  . Fibromyalgia 01/16/2011   Past Surgical History  Procedure Laterality Date  . Cholecystectomy    . Esophagogastroduodenoscopy  02/29/2004    reports that she has never smoked. She does not have any smokeless tobacco history on file. She reports that she does not drink alcohol or use illicit drugs. family history includes Diabetes in her other; Heart disease in her other; Hypertension in her other. Allergies  Allergen Reactions  . Sulfonamide Derivatives Anaphylaxis   Current Outpatient Prescriptions on File Prior to Visit  Medication Sig Dispense Refill  . CYMBALTA 60 MG capsule TAKE 1 CAPSULE (60 MG TOTAL) BY MOUTH DAILY.  30 capsule  6  .  DULoxetine (CYMBALTA) 60 MG capsule Take 1 capsule (60 mg total) by mouth daily.  30 capsule  8  . furosemide (LASIX) 20 MG tablet Take 1 tablet (20 mg total) by mouth daily.  30 tablet  5  . hydrochlorothiazide 25 MG tablet Take 1 tablet (25 mg total) by mouth daily as needed.  90 tablet  3   No current facility-administered medications on file prior to visit.   Review of Systems  Constitutional: Negative for unexpected weight change, or unusual diaphoresis  HENT: Negative for tinnitus.   Eyes: Negative for photophobia and visual disturbance.  Respiratory: Negative for choking and stridor.   Gastrointestinal: Negative for vomiting and blood in stool.  Genitourinary: Negative for hematuria and decreased urine volume.  Musculoskeletal: Negative for acute joint swelling Skin: Negative for color change and wound.  Neurological: Negative for tremors and numbness other than noted  Psychiatric/Behavioral: Negative for decreased concentration or  hyperactivity.       Objective:   Physical Exam BP 110/78  Pulse 95  Temp(Src) 98.1 F (36.7 C) (Oral)  Ht 5\' 7"  (1.702 m)  Wt 236 lb (107.049 kg)  BMI 36.95 kg/m2  SpO2 97% VS noted,  Constitutional: Pt appears well-developed and well-nourished.  HENT: Head: NCAT.  Right Ear: External ear normal.  Left Ear: External ear normal.  Eyes: Conjunctivae and EOM are normal. Pupils are equal, round, and reactive to light.  Neck: Normal range of motion. Neck supple.  Cardiovascular: Normal rate and regular rhythm.   Pulmonary/Chest: Effort normal and breath sounds normal.  Abd:  Soft, NT, non-distended, + BS Neurological: Pt is alert. Not confused  Skin: Skin is warm. No erythema.  Psychiatric: Pt behavior is normal. Thought content normal. 1-2+ nervous Has mult trigger point tender to upper and lower back cw FMS like pain    Assessment & Plan:

## 2013-07-20 NOTE — Assessment & Plan Note (Signed)
stable overall by history and exam, recent data reviewed with pt, and pt to continue medical treatment as before,  to f/u any worsening symptoms or concerns Lab Results  Component Value Date   WBC 5.9 12/14/2012   HGB 12.0 12/14/2012   HCT 36.6 12/14/2012   PLT 253.0 12/14/2012   GLUCOSE 89 12/14/2012   CHOL 162 12/14/2012   TRIG 65.0 12/14/2012   HDL 64.00 12/14/2012   LDLCALC 85 12/14/2012   ALT 23 12/14/2012   AST 20 12/14/2012   NA 140 12/14/2012   K 4.1 12/14/2012   CL 107 12/14/2012   CREATININE 0.8 12/14/2012   BUN 12 12/14/2012   CO2 25 12/14/2012   TSH 1.75 12/14/2012

## 2013-07-20 NOTE — Assessment & Plan Note (Signed)
With ongoing stressors, declines need for counseling or other tx,  to f/u any worsening symptoms or concerns

## 2013-07-20 NOTE — Assessment & Plan Note (Signed)
With recent flare, for flexeril prn, and sparing use of tramadol prn, avoid stress if possible

## 2013-07-20 NOTE — Patient Instructions (Signed)
Please take all new medication as prescribed  Please continue all other medications as before, and refills have been done if requested.  Please have the pharmacy call with any other refills you may need.   

## 2013-08-16 ENCOUNTER — Emergency Department (HOSPITAL_COMMUNITY)
Admission: EM | Admit: 2013-08-16 | Discharge: 2013-08-16 | Discharge status: Against Medical Advice | Payer: BC Managed Care – PPO | Attending: Emergency Medicine | Admitting: Emergency Medicine

## 2013-08-16 ENCOUNTER — Encounter (HOSPITAL_COMMUNITY): Payer: Self-pay | Admitting: Emergency Medicine

## 2013-08-16 ENCOUNTER — Ambulatory Visit (INDEPENDENT_AMBULATORY_CARE_PROVIDER_SITE_OTHER): Payer: BC Managed Care – PPO | Admitting: Internal Medicine

## 2013-08-16 ENCOUNTER — Encounter: Payer: Self-pay | Admitting: Internal Medicine

## 2013-08-16 VITALS — BP 108/70 | HR 107 | Temp 99.3°F | Resp 15 | Wt 237.0 lb

## 2013-08-16 DIAGNOSIS — Z872 Personal history of diseases of the skin and subcutaneous tissue: Secondary | ICD-10-CM | POA: Insufficient documentation

## 2013-08-16 DIAGNOSIS — R059 Cough, unspecified: Secondary | ICD-10-CM | POA: Insufficient documentation

## 2013-08-16 DIAGNOSIS — J111 Influenza due to unidentified influenza virus with other respiratory manifestations: Secondary | ICD-10-CM

## 2013-08-16 DIAGNOSIS — K047 Periapical abscess without sinus: Secondary | ICD-10-CM

## 2013-08-16 DIAGNOSIS — R52 Pain, unspecified: Secondary | ICD-10-CM | POA: Insufficient documentation

## 2013-08-16 DIAGNOSIS — R05 Cough: Secondary | ICD-10-CM

## 2013-08-16 DIAGNOSIS — J45909 Unspecified asthma, uncomplicated: Secondary | ICD-10-CM | POA: Insufficient documentation

## 2013-08-16 MED ORDER — HYDROCODONE-HOMATROPINE 5-1.5 MG/5ML PO SYRP: 5.0000 mL | ORAL_SOLUTION | Freq: Four times a day (QID) | ORAL | Status: DC | PRN

## 2013-08-16 MED ORDER — OSELTAMIVIR PHOSPHATE 75 MG PO CAPS: 75.0000 mg | ORAL_CAPSULE | Freq: Two times a day (BID) | ORAL | Status: DC

## 2013-08-16 NOTE — ED Notes (Signed)
Pt. reports generalized body aches with productive cough onset yesterday , denies fever or chiils. Pt. stated history of fibromyalgia currently taking Amoxicillin for back abscess , Hydrocodone and Tramadol with no relief.

## 2013-08-16 NOTE — Patient Instructions (Signed)
Plain Mucinex (NOT D) for thick secretions ;force NON dairy fluids .   Nasal cleansing in the shower as discussed with lather of mild shampoo.After 10 seconds wash off lather while  exhaling through nostrils. Make sure that all residual soap is removed to prevent irritation.  Flonase OR Nasacort AQ 1 spray in each nostril twice a day as needed. Use the "crossover" technique into opposite nostril spraying toward opposite ear @ 45 degree angle, not straight up into nostril.  Use a Neti pot daily only  as needed for significant sinus congestion; going from open side to congested side . Plain Allegra (NOT D )  160 daily , Loratidine 10 mg , OR Zyrtec 10 mg @ bedtime  as needed for itchy eyes & sneezing. NSAIDS ( Aleve, Advil, Naproxen) or Tylenol every 4 hrs as needed for fever as discussed based on label recommendations 

## 2013-08-16 NOTE — Progress Notes (Signed)
Pre visit review using our clinic review tool, if applicable. No additional management support is needed unless otherwise documented below in the visit note. 

## 2013-08-16 NOTE — Progress Notes (Signed)
   Subjective:    Patient ID: Lori Carpenter, female    DOB: 12/31/1980, 31 y.o.   MRN: [id number]  HPI    Symptoms began 08/14/13 as diffuse myalgias and arthralgias which have progressed. This has been associated with significant fatigue. Additionally she describes chills, cough, and headache.  She has been taking pain medication but this has caused somnolence. She did not take the flu shot.  She has been on amoxicillin for dental abscess since 3/16.    Review of Systems  She is not describing nasal purulence or otic discharge. She has had some facial sinus pressure.  She has had some shortness of breath with a paroxysmal cough. This causes diffuse chest discomfort. There has been no associated wheezing.     Objective:   Physical Exam General appearance:weight excess ;well nourished; no acute distress or increased work of breathing is present.  No  lymphadenopathy about the head, neck, or axilla noted.   Eyes: No conjunctival inflammation or lid edema is present. There is no scleral icterus.  Ears:  External ear exam shows no significant lesions or deformities.  Otoscopic examination reveals wax R > L Nose:  External nasal examination shows no deformity or inflammation. Nasal mucosa are boggy and moist without lesions or exudates. No septal dislocation or deviation.No obstruction to airflow.   Oral exam: Dental hygiene is good; lips and gums are healthy appearing.There is no oropharyngeal erythema or exudate noted.   Neck:  No deformities,  masses, or tenderness noted.   Supple with full range of motion without pain.   Heart:  Normal rate and regular rhythm. S1 and S2 normal without gallop, murmur, click, rub or other extra sounds.   Lungs:Chest clear to auscultation; no wheezes, rhonchi,rales ,or rubs present.No increased work of breathing.    Extremities:  No cyanosis, edema, or clubbing  noted    Skin: Warm & dry          Assessment & Plan:  #1 influenza  syndrome  #2 dental abscess; she is on amoxicillin  Plan: See orders

## 2013-10-13 ENCOUNTER — Encounter: Payer: Self-pay | Admitting: Internal Medicine

## 2013-10-13 ENCOUNTER — Ambulatory Visit (INDEPENDENT_AMBULATORY_CARE_PROVIDER_SITE_OTHER): Payer: BC Managed Care – PPO | Admitting: Internal Medicine

## 2013-10-13 VITALS — BP 112/80 | HR 85 | Temp 98.1°F | Ht 67.0 in | Wt 247.1 lb

## 2013-10-13 DIAGNOSIS — K5900 Constipation, unspecified: Secondary | ICD-10-CM

## 2013-10-13 DIAGNOSIS — F419 Anxiety disorder, unspecified: Secondary | ICD-10-CM

## 2013-10-13 DIAGNOSIS — R609 Edema, unspecified: Secondary | ICD-10-CM

## 2013-10-13 DIAGNOSIS — F329 Major depressive disorder, single episode, unspecified: Secondary | ICD-10-CM

## 2013-10-13 DIAGNOSIS — F341 Dysthymic disorder: Secondary | ICD-10-CM

## 2013-10-13 DIAGNOSIS — F32A Depression, unspecified: Secondary | ICD-10-CM

## 2013-10-13 MED ORDER — POTASSIUM CHLORIDE ER 10 MEQ PO TBCR: 10.0000 meq | EXTENDED_RELEASE_TABLET | Freq: Every day | ORAL | Status: DC

## 2013-10-13 MED ORDER — FUROSEMIDE 40 MG PO TABS: 40.0000 mg | ORAL_TABLET | Freq: Every day | ORAL | Status: DC

## 2013-10-13 NOTE — Progress Notes (Signed)
Pre visit review using our clinic review tool, if applicable. No additional management support is needed unless otherwise documented below in the visit note. 

## 2013-10-13 NOTE — Patient Instructions (Addendum)
Your EKG was OK today  Please stop the phentermine  Please take the Lasix fluid pill at 40 mg twice per day for 4 days, then take once per day only after that  Please take all new medication as prescribed - the potassium pill at one per day  Please go to the XRAY Department in the Basement (go straight as you get off the elevator) for the x-ray testing - tomorrow  Please go to the LAB in the Basement (turn left off the elevator) for the tests to be done tomorrow  You will be contacted by phone if any changes need to be made immediately.  Otherwise, you will receive a letter about your results with an explanation, but please check with MyChart first.  Please remember to sign up for MyChart if you have not done so, as this will be important to you in the future with finding out test results, communicating by private email, and scheduling acute appointments online when needed.  You will be contacted regarding the referral for: echocardiogram  Please return in 3 weeks, or sooner if needed

## 2013-10-13 NOTE — Assessment & Plan Note (Addendum)
ECG reviewed as per emr, etiology swelling unclear, for echo, labs, f/u 3 wks, for bid lasix 40 for 4 days, then 1 qd after that

## 2013-10-13 NOTE — Progress Notes (Signed)
Subjective:    Patient ID: Lori Carpenter , female    DOB: [birth year], 33 y.o.   MRN: [id number]  HPI Here to f/u, states has successfully lost wt from last documented here at 74, down to 225 at home with better diet and less calories, but then more recently in past month has gradually increased wt assoc with LE edema to current 247.  Pt denies chest pain, increased sob or doe, wheezing, orthopnea, PND, palpitations, dizziness or syncope. Pt denies new neurological symptoms such as new headache, or facial or extremity weakness or numbness   Pt denies polydipsia, polyuria.  Recently taken phentermine.  Also with recent constipation worsening in the past wk with crampy fullness, no n/v and Denies worsening reflux, dysphagia, or blood. Denies worsening depressive symptoms, suicidal ideation, or panic Past Medical History  Diagnosis Date  . Abdominal pain, unspecified site 10/10/2009  . 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 ALLERGIC RHINITIS 08/19/2007  . ASTHMA 08/19/2007  . CONSTIPATION 10/29/2009  . DIZZINESS 02/13/2010  . GASTROENTERITIS 08/19/2007  . GERD 08/19/2007  . HEMATOCHEZIA 03/10/2008  . MYALGIA 10/10/2009  . OBESITY, MORBID 08/19/2007  . PERIPHERAL EDEMA 10/10/2009  . Fibromyalgia 01/16/2011   Past Surgical History  Procedure Laterality Date  . Cholecystectomy    . Esophagogastroduodenoscopy  02/29/2004    reports that she has never smoked. She does not have any smokeless tobacco history on file. She reports that she does not drink alcohol or use illicit drugs. family history includes Diabetes in her other; Heart disease in her other; Hypertension in her other. Allergies  Allergen Reactions  . Sulfonamide Derivatives Anaphylaxis   Current Outpatient Prescriptions on File Prior to Visit  Medication Sig Dispense Refill  . cyclobenzaprine (FLEXERIL) 5 MG tablet Take 1 tablet (5 mg total) by mouth 3 (three) times daily as needed for muscle spasms.  90 tablet  1  . CYMBALTA 60 MG capsule TAKE 1  CAPSULE (60 MG TOTAL) BY MOUTH DAILY.  30 capsule  6  . DULoxetine (CYMBALTA) 60 MG capsule Take 1 capsule (60 mg total) by mouth daily.  30 capsule  8  . traMADol (ULTRAM) 50 MG tablet Take 1 tablet (50 mg total) by mouth every 8 (eight) hours as needed.  60 tablet  0  . hydrochlorothiazide 25 MG tablet Take 1 tablet (25 mg total) by mouth daily as needed.  90 tablet  3   No current facility-administered medications on file prior to visit.   Review of Systems  Constitutional: Negative for unusual diaphoresis or other sweats  HENT: Negative for ringing in ear Eyes: Negative for double vision or worsening visual disturbance.  Respiratory: Negative for choking and stridor.   Gastrointestinal: Negative for vomiting or other signifcant bowel change Genitourinary: Negative for hematuria or decreased urine volume.  Musculoskeletal: Negative for other MSK pain or swelling Skin: Negative for color change and worsening wound.  Neurological: Negative for tremors and numbness other than noted  Psychiatric/Behavioral: Negative for decreased concentration or agitation other than above       Objective:   Physical Exam BP 112/80  Pulse 85  Temp(Src) 98.1 F (36.7 C) (Oral)  Ht $R'5\' 7"'mv$  (1.702 m)  Wt 247 lb 2 oz (112.095 kg)  BMI 38.70 kg/m2  SpO2 97% VS noted,  Constitutional: Pt appears well-developed, well-nourished.  HENT: Head: NCAT.  Right Ear: External ear normal.  Left Ear: External ear normal.  Eyes: . Pupils are equal, round, and reactive to light. Conjunctivae and EOM are  normal Neck: Normal range of motion. Neck supple.  Cardiovascular: Normal rate and regular rhythm.   Pulmonary/Chest: Effort normal and breath sounds normal.  Abd:  Soft, NT, ND, + BS Neurological: Pt is alert. Not confused , motor grossly intact Skin: Skin is warm. No rash1+ edema to knees bilat Psychiatric: Pt behavior is normal. No agitation.     Assessment & Plan:

## 2013-10-14 ENCOUNTER — Encounter: Payer: Self-pay | Admitting: Internal Medicine

## 2013-10-14 ENCOUNTER — Other Ambulatory Visit (INDEPENDENT_AMBULATORY_CARE_PROVIDER_SITE_OTHER): Payer: BC Managed Care – PPO

## 2013-10-14 ENCOUNTER — Ambulatory Visit: Payer: BC Managed Care – PPO

## 2013-10-14 ENCOUNTER — Ambulatory Visit (INDEPENDENT_AMBULATORY_CARE_PROVIDER_SITE_OTHER)
Admission: RE | Admit: 2013-10-14 | Discharge: 2013-10-14 | Disposition: A | Discharge status: Home / Self Care | Payer: BC Managed Care – PPO | Source: Ambulatory Visit | Attending: Internal Medicine | Admitting: Internal Medicine

## 2013-10-14 DIAGNOSIS — D649 Anemia, unspecified: Secondary | ICD-10-CM

## 2013-10-14 DIAGNOSIS — R609 Edema, unspecified: Secondary | ICD-10-CM

## 2013-10-14 LAB — CBC WITH DIFFERENTIAL/PLATELET
BASOS ABS: 0 10*3/uL (ref 0.0–0.1)
Basophils Relative: 0.3 % (ref 0.0–3.0)
EOS ABS: 0.4 10*3/uL (ref 0.0–0.7)
Eosinophils Relative: 6.7 % — ABNORMAL HIGH (ref 0.0–5.0)
HCT: 35.3 % — ABNORMAL LOW (ref 36.0–46.0)
Hemoglobin: 11.5 g/dL — ABNORMAL LOW (ref 12.0–15.0)
Lymphocytes Relative: 23.1 % (ref 12.0–46.0)
Lymphs Abs: 1.3 10*3/uL (ref 0.7–4.0)
MCHC: 32.6 g/dL (ref 30.0–36.0)
MCV: 81.4 fl (ref 78.0–100.0)
MONOS PCT: 5.9 % (ref 3.0–12.0)
Monocytes Absolute: 0.3 10*3/uL (ref 0.1–1.0)
NEUTROS PCT: 64 % (ref 43.0–77.0)
Neutro Abs: 3.5 10*3/uL (ref 1.4–7.7)
Platelets: 242 10*3/uL (ref 150.0–400.0)
RBC: 4.34 Mil/uL (ref 3.87–5.11)
RDW: 14.7 % (ref 11.5–15.5)
WBC: 5.5 10*3/uL (ref 4.0–10.5)

## 2013-10-14 LAB — BASIC METABOLIC PANEL
BUN: 12 mg/dL (ref 6–23)
CHLORIDE: 106 meq/L (ref 96–112)
CO2: 27 meq/L (ref 19–32)
Calcium: 9 mg/dL (ref 8.4–10.5)
Creatinine, Ser: 0.8 mg/dL (ref 0.4–1.2)
GFR: 102.11 mL/min (ref >60.00)
Glucose, Bld: 93 mg/dL (ref 70–99)
Potassium: 3.8 mEq/L (ref 3.5–5.1)
Sodium: 139 mEq/L (ref 135–145)

## 2013-10-14 LAB — URINALYSIS, ROUTINE W REFLEX MICROSCOPIC
Bilirubin Urine: NEGATIVE
Hgb urine dipstick: NEGATIVE
Ketones, ur: NEGATIVE
Nitrite: NEGATIVE
PH: 6 (ref 5.0–8.0)
SPECIFIC GRAVITY, URINE: 1.025 (ref 1.000–1.030)
Total Protein, Urine: NEGATIVE
UROBILINOGEN UA: 0.2 (ref 0.0–1.0)
Urine Glucose: NEGATIVE

## 2013-10-14 LAB — HEPATIC FUNCTION PANEL
ALBUMIN: 3.3 g/dL — AB (ref 3.5–5.2)
ALT: 19 U/L (ref 0–35)
AST: 20 U/L (ref 0–37)
Alkaline Phosphatase: 51 U/L (ref 39–117)
BILIRUBIN DIRECT: 0.1 mg/dL (ref 0.0–0.3)
Total Bilirubin: 0.4 mg/dL (ref 0.2–1.2)
Total Protein: 6.9 g/dL (ref 6.0–8.3)

## 2013-10-14 LAB — BRAIN NATRIURETIC PEPTIDE: Pro B Natriuretic peptide (BNP): 17 pg/mL (ref 0.0–100.0)

## 2013-10-14 LAB — SEDIMENTATION RATE: Sed Rate: 47 mm/hr — ABNORMAL HIGH (ref 0–22)

## 2013-10-14 LAB — IRON: Iron: 70 ug/dL (ref 42–145)

## 2013-10-17 NOTE — Assessment & Plan Note (Signed)
stable overall by history and exam, and pt to continue medical treatment as before,  to f/u any worsening symptoms or concerns 

## 2013-10-17 NOTE — Assessment & Plan Note (Signed)
Ok for otc laxative prn,  to f/u any worsening symptoms or concerns

## 2013-11-03 ENCOUNTER — Ambulatory Visit: Payer: BC Managed Care – PPO | Admitting: Internal Medicine

## 2013-11-03 DIAGNOSIS — Z0289 Encounter for other administrative examinations: Secondary | ICD-10-CM

## 2013-12-08 ENCOUNTER — Ambulatory Visit (HOSPITAL_COMMUNITY): Payer: BC Managed Care – PPO

## 2014-02-22 ENCOUNTER — Other Ambulatory Visit: Payer: Self-pay

## 2014-02-22 MED ORDER — DULOXETINE HCL 60 MG PO CPEP: 60.0000 mg | ORAL_CAPSULE | Freq: Every day | ORAL | Status: DC

## 2014-03-30 ENCOUNTER — Ambulatory Visit: Payer: BC Managed Care – PPO | Admitting: Internal Medicine

## 2014-03-30 DIAGNOSIS — Z0289 Encounter for other administrative examinations: Secondary | ICD-10-CM

## 2014-06-15 ENCOUNTER — Encounter: Payer: Self-pay | Admitting: Internal Medicine

## 2014-06-15 ENCOUNTER — Ambulatory Visit (INDEPENDENT_AMBULATORY_CARE_PROVIDER_SITE_OTHER): Payer: 59 | Admitting: Internal Medicine

## 2014-06-15 VITALS — BP 112/82 | HR 97 | Temp 98.4°F | Ht 67.0 in | Wt 245.8 lb

## 2014-06-15 DIAGNOSIS — J4520 Mild intermittent asthma, uncomplicated: Secondary | ICD-10-CM

## 2014-06-15 DIAGNOSIS — H6981 Other specified disorders of Eustachian tube, right ear: Secondary | ICD-10-CM

## 2014-06-15 DIAGNOSIS — J019 Acute sinusitis, unspecified: Secondary | ICD-10-CM

## 2014-06-15 DIAGNOSIS — H6980 Other specified disorders of Eustachian tube, unspecified ear: Secondary | ICD-10-CM | POA: Insufficient documentation

## 2014-06-15 MED ORDER — FLUCONAZOLE 150 MG PO TABS: ORAL_TABLET | ORAL | Status: DC

## 2014-06-15 MED ORDER — LEVOFLOXACIN 500 MG PO TABS: 500.0000 mg | ORAL_TABLET | Freq: Every day | ORAL | Status: DC

## 2014-06-15 NOTE — Patient Instructions (Signed)
Please take all new medication as prescribed - the antibiotic (and diflucan if needed)  You can also take Delsym OTC for cough, and/or Mucinex (or it's generic off brand) for congestion, and tylenol as needed for pain.  Please continue all other medications as before, and refills have been done if requested.  Please have the pharmacy call with any other refills you may need.  Please keep your appointments with your specialists as you may have planned

## 2014-06-15 NOTE — Progress Notes (Signed)
Subjective:    Patient ID: Lori Carpenter, female    DOB: 1981-01-11, 34 y.o.   MRN: [id number]  HPI   Here with 2-3 days acute onset fever, facial pain, pressure, headache, general weakness and malaise, and greenish d/c, with mild ST and cough, but pt denies chest pain, wheezing, increased sob or doe, orthopnea, PND, increased LE swelling, palpitations, dizziness or syncope.Also with intermittent right ear and lateral neck pain with popping and crackling. Past Medical History  Diagnosis Date  . Abdominal pain, unspecified site 10/10/2009  . 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 ALLERGIC RHINITIS 08/19/2007  . ASTHMA 08/19/2007  . CONSTIPATION 10/29/2009  . DIZZINESS 02/13/2010  . GASTROENTERITIS 08/19/2007  . GERD 08/19/2007  . HEMATOCHEZIA 03/10/2008  . MYALGIA 10/10/2009  . OBESITY, MORBID 08/19/2007  . PERIPHERAL EDEMA 10/10/2009  . Fibromyalgia 01/16/2011   Past Surgical History  Procedure Laterality Date  . Cholecystectomy    . Esophagogastroduodenoscopy  02/29/2004    reports that she has never smoked. She does not have any smokeless tobacco history on file. She reports that she does not drink alcohol or use illicit drugs. family history includes Diabetes in her other; Heart disease in her other; Hypertension in her other. Allergies  Allergen Reactions  . Sulfonamide Derivatives Anaphylaxis   Current Outpatient Prescriptions on File Prior to Visit  Medication Sig Dispense Refill  . cyclobenzaprine (FLEXERIL) 5 MG tablet Take 1 tablet (5 mg total) by mouth 3 (three) times daily as needed for muscle spasms. 90 tablet 1  . DULoxetine (CYMBALTA) 60 MG capsule Take 1 capsule (60 mg total) by mouth daily. 30 capsule 8  . DULoxetine (CYMBALTA) 60 MG capsule Take 1 capsule (60 mg total) by mouth daily. 30 capsule 6  . furosemide (LASIX) 40 MG tablet Take 1 tablet (40 mg total) by mouth daily. 90 tablet 3  . potassium chloride (KLOR-CON 10) 10 MEQ tablet Take 1 tablet (10 mEq total) by mouth daily. 90  tablet 3  . traMADol (ULTRAM) 50 MG tablet Take 1 tablet (50 mg total) by mouth every 8 (eight) hours as needed. 60 tablet 0  . hydrochlorothiazide 25 MG tablet Take 1 tablet (25 mg total) by mouth daily as needed. 90 tablet 3   No current facility-administered medications on file prior to visit.   Review of Systems  Constitutional: Negative for unusual diaphoresis or other sweats  HENT: Negative for ringing in ear Eyes: Negative for double vision or worsening visual disturbance.  Respiratory: Negative for choking and stridor.   Gastrointestinal: Negative for vomiting or other signifcant bowel change Genitourinary: Negative for hematuria or decreased urine volume.  Musculoskeletal: Negative for other MSK pain or swelling Skin: Negative for color change and worsening wound.  Neurological: Negative for tremors and numbness other than noted  Psychiatric/Behavioral: Negative for decreased concentration or agitation other than above       Objective:   Physical Exam BP 112/82 mmHg  Pulse 97  Temp(Src) 98.4 F (36.9 C) (Oral)  Ht  (1.702 m)  Wt 245 lb 12 oz (111.471 kg)  BMI 38.48 kg/m2  SpO2 97% VS noted, mild ill Constitutional: Pt appears well-developed, well-nourished.  HENT: Head: NCAT.  Right Ear: External ear normal.  Left Ear: External ear normal.  Bilat tm's with mild erythema.  Max sinus areas mod tender right > left.  Pharynx with mild erythema, no exudate Eyes: . Pupils are equal, round, and reactive to light. Conjunctivae and EOM are normal Neck: Normal range of  motion. Neck supple.  Cardiovascular: Normal rate and regular rhythm.   Pulmonary/Chest: Effort normal and breath sounds without rales or wheezing.  Neurological: Pt is alert. Not confused , motor grossly intact Skin: Skin is warm. No rash Psychiatric: Pt behavior is normal. No agitation.     Assessment & Plan:

## 2014-06-15 NOTE — Progress Notes (Signed)
Pre visit review using our clinic review tool, if applicable. No additional management support is needed unless otherwise documented below in the visit note. 

## 2014-06-16 NOTE — Assessment & Plan Note (Signed)
Also for mucinex otc prn,  to f/u any worsening symptoms or concerns  

## 2014-06-16 NOTE — Assessment & Plan Note (Signed)
Mild to mod, for antibx course,  to f/u any worsening symptoms or concerns 

## 2014-06-16 NOTE — Assessment & Plan Note (Signed)
stable overall by history and exam, recent data reviewed with pt, and pt to continue medical treatment as before,  to f/u any worsening symptoms or concerns SpO2 Readings from Last 3 Encounters:  06/15/14 97%  10/13/13 97%  08/16/13 98%

## 2014-06-20 IMAGING — CR DG CHEST 2V
2 series · 2 of 2 positions shown · non-contrast
Comparison: None.

CLINICAL DATA: Edema.

EXAM:
CHEST  2 VIEW

[view not recorded (1 of 2)]
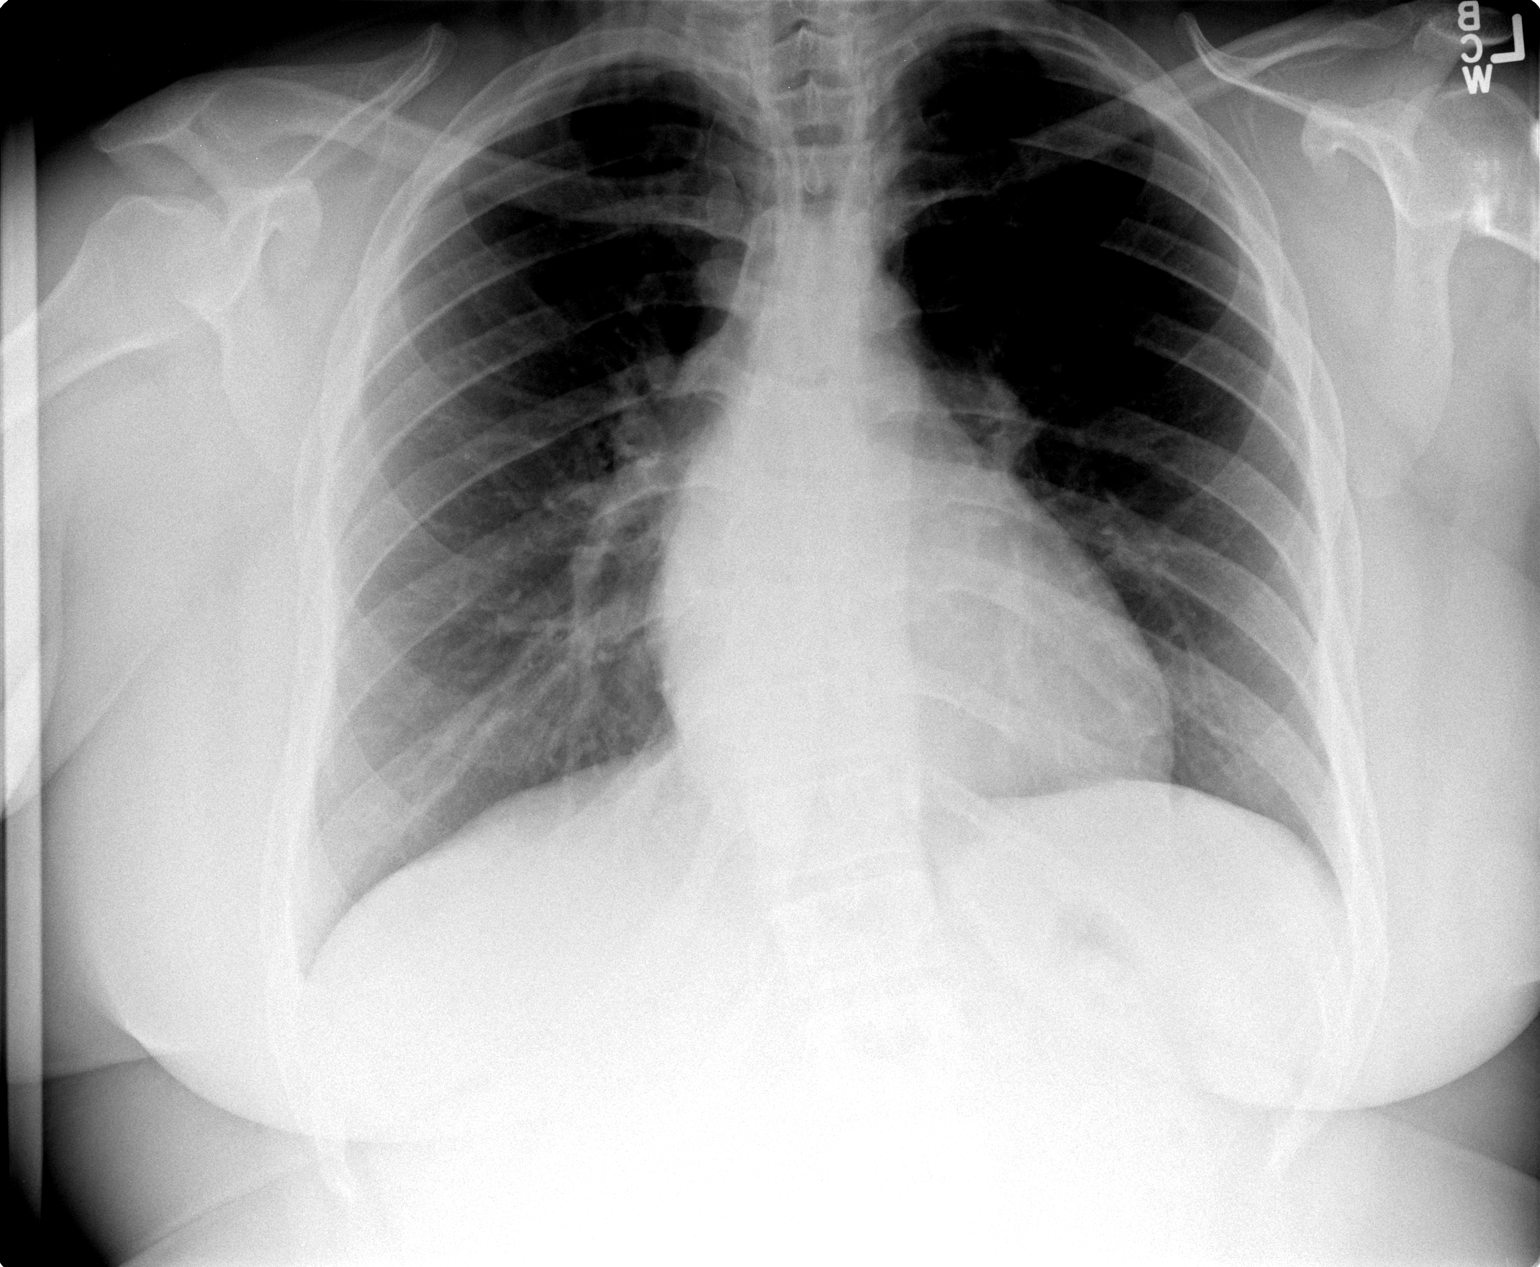

[view not recorded (2 of 2)]
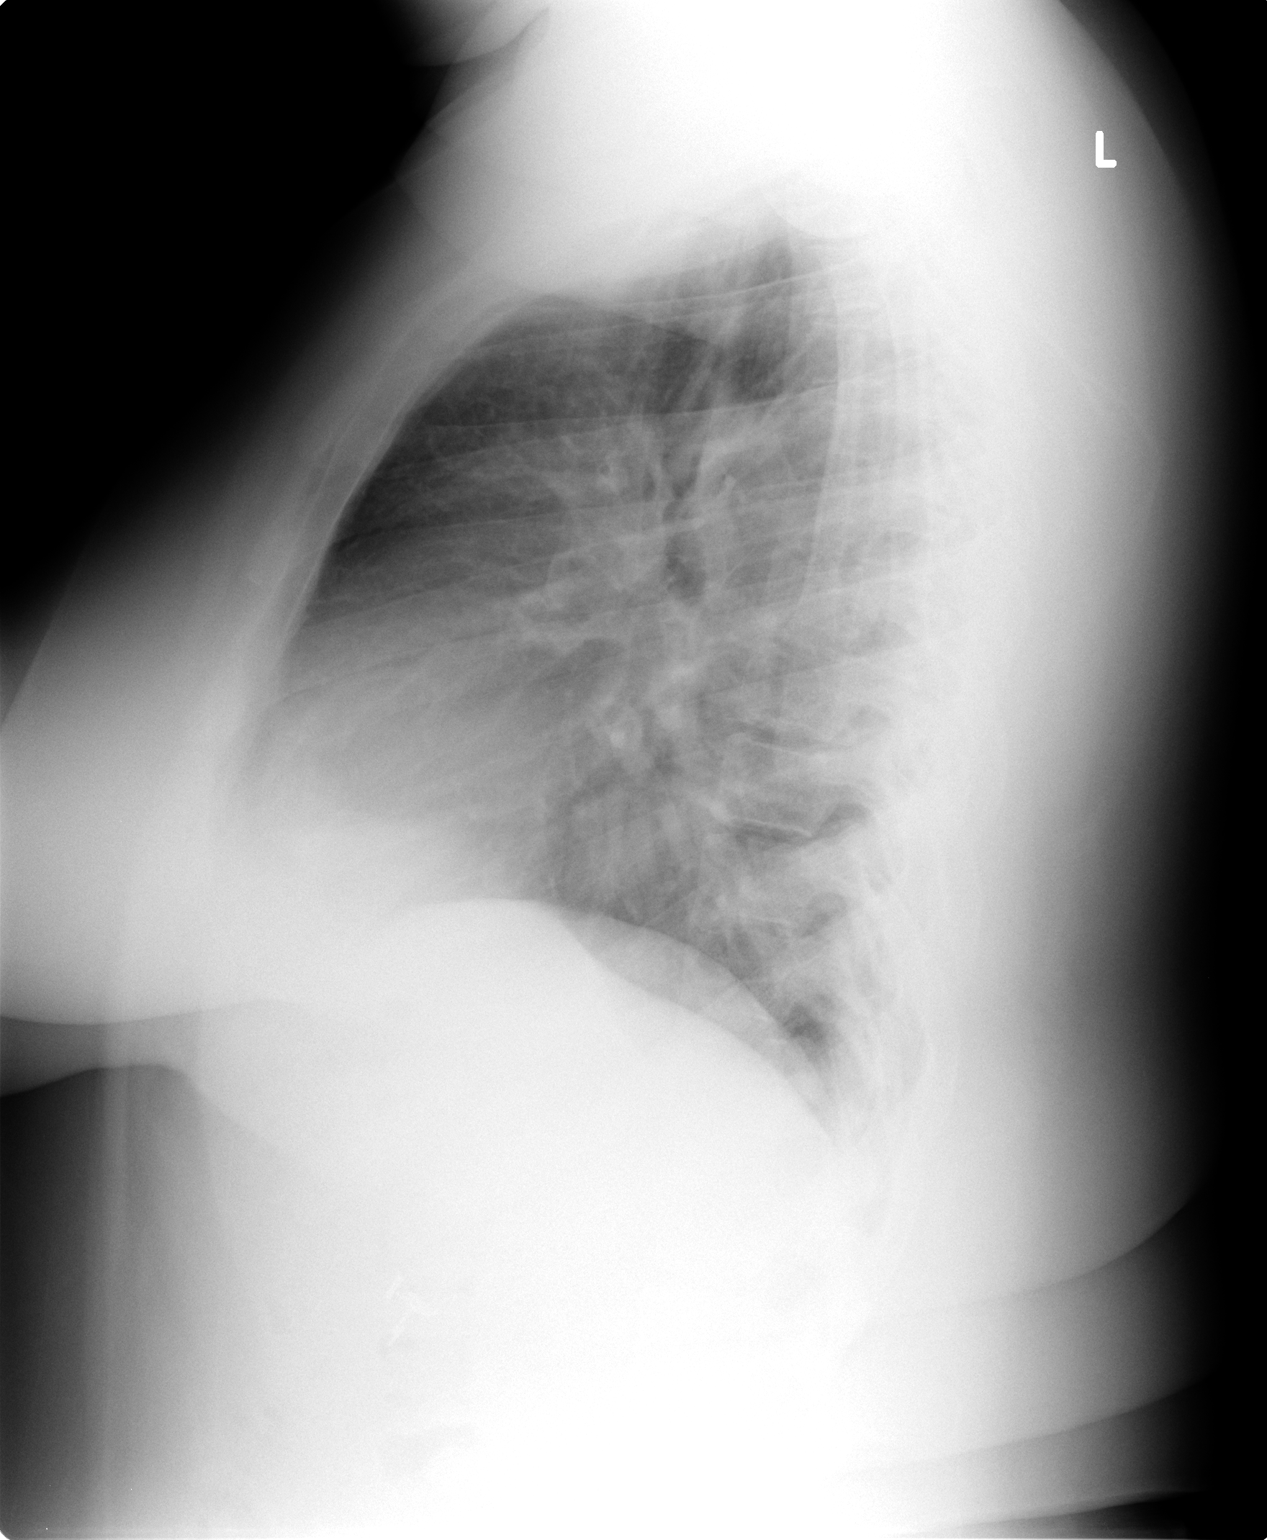

[2 of 2 positions shown; findings below may reference images not displayed]

FINDINGS: Mediastinum and hilar structures normal. Lungs are clear. Heart size
normal. No pleural effusion or pneumothorax.
IMPRESSION: No acute abnormality.  Chest stable from prior exam .

## 2014-10-04 ENCOUNTER — Other Ambulatory Visit: Payer: Self-pay | Admitting: Internal Medicine

## 2014-10-26 ENCOUNTER — Other Ambulatory Visit: Payer: Self-pay | Admitting: Internal Medicine

## 2014-11-04 ENCOUNTER — Other Ambulatory Visit: Payer: Self-pay | Admitting: Internal Medicine

## 2014-11-29 ENCOUNTER — Ambulatory Visit (INDEPENDENT_AMBULATORY_CARE_PROVIDER_SITE_OTHER): Payer: PRIVATE HEALTH INSURANCE | Admitting: Internal Medicine

## 2014-11-29 ENCOUNTER — Encounter: Payer: Self-pay | Admitting: Internal Medicine

## 2014-11-29 VITALS — BP 118/84 | HR 81 | Temp 98.1°F | Ht 67.0 in | Wt 253.0 lb

## 2014-11-29 DIAGNOSIS — F418 Other specified anxiety disorders: Secondary | ICD-10-CM | POA: Diagnosis not present

## 2014-11-29 DIAGNOSIS — N6310 Unspecified lump in the right breast, unspecified quadrant: Secondary | ICD-10-CM | POA: Insufficient documentation

## 2014-11-29 DIAGNOSIS — F419 Anxiety disorder, unspecified: Secondary | ICD-10-CM

## 2014-11-29 DIAGNOSIS — F329 Major depressive disorder, single episode, unspecified: Secondary | ICD-10-CM

## 2014-11-29 DIAGNOSIS — N63 Unspecified lump in breast: Secondary | ICD-10-CM | POA: Diagnosis not present

## 2014-11-29 DIAGNOSIS — N6320 Unspecified lump in the left breast, unspecified quadrant: Principal | ICD-10-CM

## 2014-11-29 DIAGNOSIS — J4520 Mild intermittent asthma, uncomplicated: Secondary | ICD-10-CM | POA: Diagnosis not present

## 2014-11-29 DIAGNOSIS — F32A Depression, unspecified: Secondary | ICD-10-CM

## 2014-11-29 NOTE — Progress Notes (Signed)
Subjective:    Patient ID: Lori Carpenter, female    DOB: 12/17/1980, 34 y.o.   MRN: [id number]  HPI  Here with c/o tender sub nodule area to right breast medial lower quadrant just medial to areola, without fever, sweling, skin change, nipple dc.  Has had similar in past but this time the soreness and lump has stayed.  Not aware of any other lumps.  No prior specific hx of breast disorder, no prior mammogram, biopsy or other treatment. Pt denies chest pain, increased sob or doe, wheezing, orthopnea, PND, increased LE swelling, palpitations, dizziness or syncope.  Pt denies new neurological symptoms such as new headache, or facial or extremity weakness or numbness  Denies worsening depressive symptoms, suicidal ideation, or panic Past Medical History  Diagnosis Date  . Abdominal pain, unspecified site 10/10/2009  . 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 ALLERGIC RHINITIS 08/19/2007  . ASTHMA 08/19/2007  . CONSTIPATION 10/29/2009  . DIZZINESS 02/13/2010  . GASTROENTERITIS 08/19/2007  . GERD 08/19/2007  . HEMATOCHEZIA 03/10/2008  . MYALGIA 10/10/2009  . OBESITY, MORBID 08/19/2007  . PERIPHERAL EDEMA 10/10/2009  . Fibromyalgia 01/16/2011   Past Surgical History  Procedure Laterality Date  . Cholecystectomy    . Esophagogastroduodenoscopy  02/29/2004    reports that she has never smoked. She does not have any smokeless tobacco history on file. She reports that she does not drink alcohol or use illicit drugs. family history includes Diabetes in her other; Heart disease in her other; Hypertension in her other. Allergies  Allergen Reactions  . Sulfonamide Derivatives Anaphylaxis   Current Outpatient Prescriptions on File Prior to Visit  Medication Sig Dispense Refill  . DULoxetine (CYMBALTA) 60 MG capsule Take 1 capsule (60 mg total) by mouth daily. 90 capsule 3  . furosemide (LASIX) 40 MG tablet TAKE 1 TABLET (40 MG TOTAL) BY MOUTH DAILY. 90 tablet 1  . cyclobenzaprine (FLEXERIL) 5 MG tablet Take 1 tablet (5  mg total) by mouth 3 (three) times daily as needed for muscle spasms. (Patient not taking: Reported on 11/29/2014) 90 tablet 1  . fluconazole (DIFLUCAN) 150 MG tablet 1 tab by mouth every 3 days as needed (Patient not taking: Reported on 11/29/2014) 2 tablet 1  . hydrochlorothiazide 25 MG tablet Take 1 tablet (25 mg total) by mouth daily as needed. 90 tablet 3  . levofloxacin (LEVAQUIN) 500 MG tablet Take 1 tablet (500 mg total) by mouth daily. (Patient not taking: Reported on 11/29/2014) 10 tablet 0  . potassium chloride (KLOR-CON 10) 10 MEQ tablet Take 1 tablet (10 mEq total) by mouth daily. (Patient not taking: Reported on 11/29/2014) 90 tablet 3  . traMADol (ULTRAM) 50 MG tablet Take 1 tablet (50 mg total) by mouth every 8 (eight) hours as needed. (Patient not taking: Reported on 11/29/2014) 60 tablet 0   No current facility-administered medications on file prior to visit.   Review of Systems  Constitutional: Negative for unusual diaphoresis or night sweats HENT: Negative for ringing in ear or discharge Eyes: Negative for double vision or worsening visual disturbance.  Respiratory: Negative for choking and stridor.   Gastrointestinal: Negative for vomiting or other signifcant bowel change Genitourinary: Negative for hematuria or change in urine volume.  Musculoskeletal: Negative for other MSK pain or swelling Skin: Negative for color change and worsening wound.  Neurological: Negative for tremors and numbness other than noted  Psychiatric/Behavioral: Negative for decreased concentration or agitation other than above       Objective:   Physical  Exam BP 118/84 mmHg  Pulse 81  Temp(Src) 98.1 F (36.7 C) (Oral)  Ht 5\' 7"  (1.702 m)  Wt 253 lb (114.76 kg)  BMI 39.62 kg/m2  SpO2 98%  LMP 11/27/2014 VS noted,  Constitutional: Pt appears in no significant distress HENT: Head: NCAT.  Right Ear: External ear normal.  Left Ear: External ear normal.  Eyes: . Pupils are equal, round, and reactive to  light. Conjunctivae and EOM are normal Neck: Normal range of motion. Neck supple.  Cardiovascular: Normal rate and regular rhythm.   Pulmonary/Chest: Effort normal and breath sounds without rales or wheezing.  Right breast:  2 total < 1 cm sub nodules tender, without skin change or nipple d/c; first located just medial to areola , second at 7-8oclock position lateral to same Left breast: also appreciated 2 < 1 cm nodular masses, neither tender, 1 located medial aspect lower quadrant near sternal border, and second located approx 1 cm lateral to areola at approx 3-4oclock Neurological: Pt is alert. Not confused , motor grossly intact Skin: Skin is warm. No rash, no LE edema Psychiatric: Pt behavior is normal. No agitation. not depressed affect, mild nervous     Assessment & Plan:

## 2014-11-29 NOTE — Assessment & Plan Note (Signed)
stable overall by history and exam, and pt to continue medical treatment as before,  to f/u any worsening symptoms or concerns 

## 2014-11-29 NOTE — Progress Notes (Signed)
Pre visit review using our clinic review tool, if applicable. No additional management support is needed unless otherwise documented below in the visit note. 

## 2014-11-29 NOTE — Patient Instructions (Signed)
.  Please continue all other medications as before, and refills have been done if requested.  Please have the pharmacy call with any other refills you may need.  Please keep your appointments with your specialists as you may have planned  You will be contacted regarding the referral for: diagnostic mammogram

## 2014-11-29 NOTE — Assessment & Plan Note (Signed)
stable overall by history and exam, recent data reviewed with pt, and pt to continue medical treatment as before,  to f/u any worsening symptoms or concerns SpO2 Readings from Last 3 Encounters:  11/29/14 98%  06/15/14 97%  10/13/13 97%

## 2014-11-30 ENCOUNTER — Other Ambulatory Visit: Payer: Self-pay | Admitting: Internal Medicine

## 2014-11-30 DIAGNOSIS — N6320 Unspecified lump in the left breast, unspecified quadrant: Principal | ICD-10-CM

## 2014-11-30 DIAGNOSIS — N6310 Unspecified lump in the right breast, unspecified quadrant: Secondary | ICD-10-CM

## 2014-12-04 ENCOUNTER — Ambulatory Visit
Admission: RE | Admit: 2014-12-04 | Discharge: 2014-12-04 | Disposition: A | Discharge status: Home / Self Care | Payer: PRIVATE HEALTH INSURANCE | Source: Ambulatory Visit | Attending: Internal Medicine | Admitting: Internal Medicine

## 2014-12-04 DIAGNOSIS — N6310 Unspecified lump in the right breast, unspecified quadrant: Secondary | ICD-10-CM

## 2014-12-04 DIAGNOSIS — N6320 Unspecified lump in the left breast, unspecified quadrant: Principal | ICD-10-CM

## 2014-12-29 ENCOUNTER — Telehealth: Payer: Self-pay | Admitting: *Deleted

## 2014-12-29 MED ORDER — FLUCONAZOLE 150 MG PO TABS: ORAL_TABLET | ORAL | Status: DC

## 2014-12-29 NOTE — Telephone Encounter (Signed)
Pt states she completed amoxillian rx by her dentist. She now has a yeast infection would like refill on her diflucan. Is this ok...Lori Carpenter

## 2014-12-29 NOTE — Telephone Encounter (Signed)
As long as she is not pregnant, ok - done erx

## 2014-12-29 NOTE — Telephone Encounter (Signed)
Notified pt with md response.../lmb 

## 2015-02-26 ENCOUNTER — Ambulatory Visit: Payer: PRIVATE HEALTH INSURANCE | Admitting: Internal Medicine

## 2015-02-26 ENCOUNTER — Emergency Department (HOSPITAL_BASED_OUTPATIENT_CLINIC_OR_DEPARTMENT_OTHER)
Admission: EM | Admit: 2015-02-26 | Discharge: 2015-02-26 | Disposition: A | Discharge status: Home / Self Care | Payer: No Typology Code available for payment source | Attending: Emergency Medicine | Admitting: Emergency Medicine

## 2015-02-26 ENCOUNTER — Encounter (HOSPITAL_BASED_OUTPATIENT_CLINIC_OR_DEPARTMENT_OTHER): Payer: Self-pay

## 2015-02-26 DIAGNOSIS — J45909 Unspecified asthma, uncomplicated: Secondary | ICD-10-CM | POA: Insufficient documentation

## 2015-02-26 DIAGNOSIS — M5412 Radiculopathy, cervical region: Secondary | ICD-10-CM | POA: Diagnosis not present

## 2015-02-26 DIAGNOSIS — Z0289 Encounter for other administrative examinations: Secondary | ICD-10-CM

## 2015-02-26 DIAGNOSIS — Z8719 Personal history of other diseases of the digestive system: Secondary | ICD-10-CM | POA: Diagnosis not present

## 2015-02-26 DIAGNOSIS — Z79899 Other long term (current) drug therapy: Secondary | ICD-10-CM | POA: Insufficient documentation

## 2015-02-26 DIAGNOSIS — M542 Cervicalgia: Secondary | ICD-10-CM | POA: Diagnosis present

## 2015-02-26 MED ORDER — NAPROXEN 250 MG PO TABS
500.0000 mg | ORAL_TABLET | Freq: Once | ORAL | Status: AC
  Administered 2015-02-26: 500 mg via ORAL
  Filled 2015-02-26: qty 2

## 2015-02-26 MED ORDER — HYDROCODONE-ACETAMINOPHEN 5-325 MG PO TABS: 1.0000 | ORAL_TABLET | Freq: Four times a day (QID) | ORAL | Status: DC | PRN

## 2015-02-26 MED ORDER — NAPROXEN SODIUM 550 MG PO TABS: ORAL_TABLET | ORAL | Status: DC

## 2015-02-26 NOTE — Discharge Instructions (Signed)

## 2015-02-26 NOTE — ED Provider Notes (Signed)
CSN: [id number]     Arrival date & time 02/26/15  2244 History  By signing my name below, I, Lori Carpenter, attest that this documentation has been prepared under the direction and in the presence of Paula Libra, MD. Electronically Signed: Soijett Carpenter, ED Scribe. 02/26/2015. 11:02 PM.   Chief Complaint  Patient presents with  . Neck Pain      The history is provided by the patient. No language interpreter was used.    Lori Carpenter is a 34 y.o. female with a hx of fibromyalgia who presents to the Emergency Department complaining of right sided neck pain onset 1 week ago, worsening. She notes that her pain radiates to her right shoulder and is a "pulling" sensation. She reports that she is unable to move her head without pain to the area and she has not slept due to the pain, it being severe at times. She voices that these symptoms may be something other than her fibromyalgia. She denies a fall or MVC that could have triggered her symptoms. She notes that she has tried Aleve with partial relief of her symptoms. She denies fever, gait problem, numbness/weakness in right arm, abdominal pain, bowel/bladder incontinence or any other symptoms. Pt states that her PCP is Dr. Oliver Barre and she has an appointment with him on 02/28/15.   Past Medical History  Diagnosis Date  . Abdominal pain, unspecified site 10/10/2009  . 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 ALLERGIC RHINITIS 08/19/2007  . ASTHMA 08/19/2007  . CONSTIPATION 10/29/2009  . DIZZINESS 02/13/2010  . GASTROENTERITIS 08/19/2007  . GERD 08/19/2007  . HEMATOCHEZIA 03/10/2008  . MYALGIA 10/10/2009  . OBESITY, MORBID 08/19/2007  . PERIPHERAL EDEMA 10/10/2009  . Fibromyalgia 01/16/2011   Past Surgical History  Procedure Laterality Date  . Cholecystectomy    . Esophagogastroduodenoscopy  02/29/2004   Family History  Problem Relation Age of Onset  . Diabetes Other   . Hypertension Other   . Heart disease Other    Social History  Substance Use Topics  .  Smoking status: Never Smoker   . Smokeless tobacco: None  . Alcohol Use: Yes     Comment: occ   OB History    No data available     Review of Systems  A complete 10 system review of systems was obtained and all systems are negative except as noted in the HPI and PMH.    Allergies  Sulfonamide derivatives  Home Medications   Prior to Admission medications   Medication Sig Start Date End Date Taking? Authorizing Provider  DULoxetine (CYMBALTA) 60 MG capsule Take 1 capsule (60 mg total) by mouth daily. 11/06/14   Corwin Levins, MD  furosemide (LASIX) 40 MG tablet TAKE 1 TABLET (40 MG TOTAL) BY MOUTH DAILY. 10/26/14   Corwin Levins, MD  HYDROcodone-acetaminophen (NORCO/VICODIN) 5-325 MG tablet Take 1-2 tablets by mouth every 6 (six) hours as needed for severe pain. 02/26/15   Arlita Buffkin, MD  naproxen sodium (ANAPROX DS) 550 MG tablet Take 1 tablet twice daily with food for neck pain. 02/26/15   Bunnie Rehberg, MD   BP 130/81 mmHg  Pulse 98  Temp(Src) 97.9 F (36.6 C) (Oral)  Resp 18  Ht  (1.702 m)  Wt 255 lb (115.667 kg)  BMI 39.93 kg/m2  SpO2 98%  LMP 02/16/2015   Physical Exam General: Well-developed, well-nourished female in no acute distress; appearance consistent with age of record HENT: normocephalic; atraumatic Eyes: pupils equal, round and reactive to  light; extraocular muscles intact Neck: supple, rotation left, right, or extension reproduces pain in the right neck. Heart: regular rate and rhythm Lungs: clear to auscultation bilaterally Abdomen: soft; nondistended; nontender Extremities: No deformity; full range of motion; pulses normal Neurologic: Awake, alert and oriented; motor function intact in all extremities and symmetric; sensation intact and symmetric in all extremities, no facial droop.  Skin: Warm and dry Psychiatric: Normal mood and affect   ED Course  Procedures (including critical care time) DIAGNOSTIC STUDIES: Oxygen Saturation is 98% on RA, nl by  my interpretation.    COORDINATION OF CARE: 11:01 PM Discussed treatment plan with pt at bedside which includes referral to neurology and pt agreed to plan.    MDM   Final diagnoses:  Cervical radiculopathy   I personally performed the services described in this documentation, which was scribed in my presence. The recorded information has been reviewed and is accurate.    Paula Libra, MD 02/26/15 [phone]

## 2015-02-26 NOTE — ED Notes (Signed)
MD at bedside. 

## 2015-02-26 NOTE — ED Notes (Signed)
C/o neck pain x 1 week-denies fever or injury-steady gait

## 2015-03-02 ENCOUNTER — Ambulatory Visit (INDEPENDENT_AMBULATORY_CARE_PROVIDER_SITE_OTHER): Payer: No Typology Code available for payment source | Admitting: Internal Medicine

## 2015-03-02 ENCOUNTER — Encounter: Payer: Self-pay | Admitting: Internal Medicine

## 2015-03-02 ENCOUNTER — Other Ambulatory Visit (INDEPENDENT_AMBULATORY_CARE_PROVIDER_SITE_OTHER): Payer: No Typology Code available for payment source

## 2015-03-02 VITALS — BP 110/64 | HR 99 | Wt 257.0 lb

## 2015-03-02 DIAGNOSIS — M542 Cervicalgia: Secondary | ICD-10-CM

## 2015-03-02 DIAGNOSIS — M797 Fibromyalgia: Secondary | ICD-10-CM

## 2015-03-02 LAB — HEPATIC FUNCTION PANEL
ALBUMIN: 3.7 g/dL (ref 3.5–5.2)
ALK PHOS: 63 U/L (ref 39–117)
ALT: 20 U/L (ref 0–35)
AST: 20 U/L (ref 0–37)
Bilirubin, Direct: 0.1 mg/dL (ref 0.0–0.3)
Total Bilirubin: 0.2 mg/dL (ref 0.2–1.2)
Total Protein: 7.2 g/dL (ref 6.0–8.3)

## 2015-03-02 LAB — CBC WITH DIFFERENTIAL/PLATELET
BASOS ABS: 0 10*3/uL (ref 0.0–0.1)
BASOS PCT: 0.5 % (ref 0.0–3.0)
Eosinophils Absolute: 0.2 10*3/uL (ref 0.0–0.7)
Eosinophils Relative: 2.5 % (ref 0.0–5.0)
HEMATOCRIT: 35.9 % — AB (ref 36.0–46.0)
HEMOGLOBIN: 11.6 g/dL — AB (ref 12.0–15.0)
Lymphocytes Relative: 27.7 % (ref 12.0–46.0)
Lymphs Abs: 2 10*3/uL (ref 0.7–4.0)
MCHC: 32.4 g/dL (ref 30.0–36.0)
MCV: 79.3 fl (ref 78.0–100.0)
MONO ABS: 0.4 10*3/uL (ref 0.1–1.0)
Monocytes Relative: 5.6 % (ref 3.0–12.0)
Neutro Abs: 4.6 10*3/uL (ref 1.4–7.7)
Neutrophils Relative %: 63.7 % (ref 43.0–77.0)
Platelets: 292 10*3/uL (ref 150.0–400.0)
RBC: 4.53 Mil/uL (ref 3.87–5.11)
RDW: 14.8 % (ref 11.5–15.5)
WBC: 7.2 10*3/uL (ref 4.0–10.5)

## 2015-03-02 LAB — BASIC METABOLIC PANEL
BUN: 11 mg/dL (ref 6–23)
CO2: 26 mEq/L (ref 19–32)
Calcium: 9.4 mg/dL (ref 8.4–10.5)
Chloride: 102 mEq/L (ref 96–112)
Creatinine, Ser: 0.81 mg/dL (ref 0.40–1.20)
GFR: 105.53 mL/min (ref >60.00)
Glucose, Bld: 109 mg/dL — ABNORMAL HIGH (ref 70–99)
POTASSIUM: 3.7 meq/L (ref 3.5–5.1)
SODIUM: 138 meq/L (ref 135–145)

## 2015-03-02 LAB — SEDIMENTATION RATE: Sed Rate: 61 mm/hr — ABNORMAL HIGH (ref 0–22)

## 2015-03-02 MED ORDER — CYCLOBENZAPRINE HCL 5 MG PO TABS: 5.0000 mg | ORAL_TABLET | Freq: Every day | ORAL | Status: DC

## 2015-03-02 MED ORDER — VITAMIN D3 50 MCG (2000 UT) PO CAPS: 2000.0000 [IU] | ORAL_CAPSULE | Freq: Every day | ORAL | Status: AC

## 2015-03-02 MED ORDER — NABUMETONE 750 MG PO TABS: 750.0000 mg | ORAL_TABLET | Freq: Two times a day (BID) | ORAL | Status: DC

## 2015-03-02 MED ORDER — METHYLPREDNISOLONE ACETATE 40 MG/ML INJ SUSP (RADIOLOG
120.0000 mg | Freq: Once | INTRAMUSCULAR | Status: AC
  Administered 2015-03-02: 120 mg via INTRAMUSCULAR

## 2015-03-02 NOTE — Assessment & Plan Note (Signed)
10/16 MSK trap spasm Trigger pont inj Relafen, Flexeril

## 2015-03-02 NOTE — Patient Instructions (Addendum)
Rice bag Contour pillow

## 2015-03-02 NOTE — Progress Notes (Signed)
Subjective:  Patient ID: Lori Carpenter, female    DOB: 13-May-1981  Age: 34 y.o. MRN: [id number]  CC: No chief complaint on file.   HPI Lori Carpenter presents for R neck pain - ER visit on 10/3:  Hx:"Lori Carpenter is a 34 y.o. female with a hx of fibromyalgia who presents to the Emergency Department complaining of right sided neck pain onset 1 week ago, worsening. She notes that her pain radiates to her right shoulder and is a "pulling" sensation. She reports that she is unable to move her head without pain to the area and she has not slept due to the pain, it being severe at times. She voices that these symptoms may be something other than her fibromyalgia. She denies a fall or MVC that could have triggered her symptoms. She notes that she has tried Aleve with partial relief of her symptoms. She denies fever, gait problem, numbness/weakness in right arm, abdominal pain, bowel/bladder incontinence or any other symptoms. Pt states that her PCP is Dr. Oliver Barre and she has an appointment with him on 02/28/15."  Outpatient Prescriptions Prior to Visit  Medication Sig Dispense Refill  . DULoxetine (CYMBALTA) 60 MG capsule Take 1 capsule (60 mg total) by mouth daily. 90 capsule 3  . furosemide (LASIX) 40 MG tablet TAKE 1 TABLET (40 MG TOTAL) BY MOUTH DAILY. 90 tablet 1  . HYDROcodone-acetaminophen (NORCO/VICODIN) 5-325 MG tablet Take 1-2 tablets by mouth every 6 (six) hours as needed for severe pain. 20 tablet 0  . naproxen sodium (ANAPROX DS) 550 MG tablet Take 1 tablet twice daily with food for neck pain. 20 tablet 0   No facility-administered medications prior to visit.    ROS Review of Systems  Constitutional: Negative for chills, activity change, appetite change, fatigue and unexpected weight change.  HENT: Negative for congestion, mouth sores and sinus pressure.   Eyes: Negative for visual disturbance.  Respiratory: Negative for cough and chest tightness.   Gastrointestinal:  Negative for nausea and abdominal pain.  Genitourinary: Negative for frequency, difficulty urinating and vaginal pain.  Musculoskeletal: Positive for arthralgias, neck pain and neck stiffness. Negative for back pain and gait problem.  Skin: Negative for pallor and rash.  Neurological: Negative for dizziness, tremors, weakness, numbness and headaches.  Psychiatric/Behavioral: Negative for confusion and sleep disturbance.    Objective:  BP 110/64 mmHg  Pulse 99  Wt 257 lb (116.574 kg)  SpO2 97%  LMP 02/16/2015  BP Readings from Last 3 Encounters:  03/02/15 110/64  02/26/15 130/81  11/29/14 118/84    Wt Readings from Last 3 Encounters:  03/02/15 257 lb (116.574 kg)  02/26/15 255 lb (115.667 kg)  11/29/14 253 lb (114.76 kg)    Physical Exam  Constitutional: She appears well-developed. No distress.  HENT:  Head: Normocephalic.  Right Ear: External ear normal.  Left Ear: External ear normal.  Nose: Nose normal.  Mouth/Throat: Oropharynx is clear and moist.  Eyes: Conjunctivae are normal. Pupils are equal, round, and reactive to light. Right eye exhibits no discharge. Left eye exhibits no discharge.  Neck: Normal range of motion. Neck supple. No JVD present. No tracheal deviation present. No thyromegaly present.  Cardiovascular: Normal rate, regular rhythm and normal heart sounds.   Pulmonary/Chest: No stridor. No respiratory distress. She has no wheezes.  Abdominal: Soft. Bowel sounds are normal. She exhibits no distension and no mass. There is no tenderness. There is no rebound and no guarding.  Musculoskeletal: She exhibits no  edema or tenderness.  Lymphadenopathy:    She has no cervical adenopathy.  Neurological: She displays normal reflexes. No cranial nerve deficit. She exhibits normal muscle tone. Coordination normal.  Skin: No rash noted. No erythema.  Psychiatric: She has a normal mood and affect. Her behavior is normal. Judgment and thought content normal.    Lab  Results  Component Value Date   WBC 7.2 03/02/2015   HGB 11.6* 03/02/2015   HCT 35.9* 03/02/2015   PLT 292.0 03/02/2015   GLUCOSE 109* 03/02/2015   CHOL 162 12/14/2012   TRIG 65.0 12/14/2012   HDL 64.00 12/14/2012   LDLCALC 85 12/14/2012   ALT 20 03/02/2015   AST 20 03/02/2015   NA 138 03/02/2015   K 3.7 03/02/2015   CL 102 03/02/2015   CREATININE 0.81 03/02/2015   BUN 11 03/02/2015   CO2 26 03/02/2015   TSH 1.28 03/02/2015    Procedure Note :    Trigger Point Injection:   Indication : Focal tender area identifiable by the location without other identifiable neurologic or musculoskeletal finding or pathology.   Risks including unsuccessful procedure , bleeding, infection, bruising, skin atrophy and others were explained to the patient in detail as well as the benefits. Informed consent was obtained and signed.   Tthe patient was placed in a comfortable position. 4   points of maximum tenderness over R paraspinal and trapezius muscles were marked and  the skin was prepped with Betadine and alcohol. 1 inch 25-gauge needle was used. The needle was advanced perpendicular to the skin. Each trigger point was injected with 1 mL of 2% lidocaine and 10 mg of Depo-Medrol in a usual fashion.  Band-Aids applied.   Tolerated well. Complications: None. Good pain relief following the procedure.  No results found.  Assessment & Plan:   Diagnoses and all orders for this visit:  Neck pain on right side -     Vitamin B12; Future -     Vit D  25 hydroxy (rtn osteoporosis monitoring); Future -     TSH; Future -     Sedimentation rate; Future -     CBC with Differential/Platelet; Future -     Basic metabolic panel; Future -     Hepatic function panel; Future -     methylPREDNISolone acetate (DEPO-MEDROL) injection (RADIOLOGY ONLY) 120 mg; Inject 3 mLs (120 mg total) into the muscle once.  Morbid obesity due to excess calories (HCC) -     Vitamin B12; Future -     Vit D  25 hydroxy (rtn  osteoporosis monitoring); Future -     TSH; Future -     Sedimentation rate; Future -     CBC with Differential/Platelet; Future -     Basic metabolic panel; Future -     Hepatic function panel; Future  Fibromyalgia  Other orders -     cyclobenzaprine (FLEXERIL) 5 MG tablet; Take 1 tablet (5 mg total) by mouth at bedtime. -     nabumetone (RELAFEN) 750 MG tablet; Take 1 tablet (750 mg total) by mouth 2 (two) times daily. -     Cholecalciferol (VITAMIN D3) 2000 UNITS capsule; Take 1 capsule (2,000 Units total) by mouth daily.  I have discontinued Lori Carpenter naproxen sodium. I am also having her start on cyclobenzaprine, nabumetone, and Vitamin D3. Additionally, I am having her maintain her furosemide, DULoxetine, and HYDROcodone-acetaminophen. We administered methylPREDNISolone acetate.  Meds ordered this encounter  Medications  . cyclobenzaprine (FLEXERIL) 5  MG tablet    Sig: Take 1 tablet (5 mg total) by mouth at bedtime.    Dispense:  30 tablet    Refill:  1  . nabumetone (RELAFEN) 750 MG tablet    Sig: Take 1 tablet (750 mg total) by mouth 2 (two) times daily.    Dispense:  60 tablet    Refill:  1  . Cholecalciferol (VITAMIN D3) 2000 UNITS capsule    Sig: Take 1 capsule (2,000 Units total) by mouth daily.    Dispense:  100 capsule    Refill:  3  . methylPREDNISolone acetate (DEPO-MEDROL) injection (RADIOLOGY ONLY) 120 mg    Sig:      Follow-up: Return in about 2 weeks (around 03/16/2015) for a follow-up visit.  Sonda Primes, MD

## 2015-03-02 NOTE — Assessment & Plan Note (Signed)
Worse Labs Relafen Flexeril

## 2015-03-02 NOTE — Progress Notes (Signed)
Pre visit review using our clinic review tool, if applicable. No additional management support is needed unless otherwise documented below in the visit note. 

## 2015-03-02 NOTE — Assessment & Plan Note (Signed)
Worse Labs 

## 2015-03-05 ENCOUNTER — Other Ambulatory Visit: Payer: Self-pay | Admitting: Internal Medicine

## 2015-03-05 LAB — VITAMIN B12: Vitamin B-12: 221 pg/mL (ref 211–911)

## 2015-03-05 LAB — TSH: TSH: 1.28 u[IU]/mL (ref 0.35–4.50)

## 2015-03-05 LAB — VITAMIN D 25 HYDROXY (VIT D DEFICIENCY, FRACTURES): VITD: 13.4 ng/mL — AB (ref 30.00–100.00)

## 2015-03-05 MED ORDER — ERGOCALCIFEROL 1.25 MG (50000 UT) PO CAPS: 50000.0000 [IU] | ORAL_CAPSULE | ORAL | Status: DC

## 2015-03-05 MED ORDER — VITAMIN B-12 1000 MCG SL SUBL: 1.0000 | SUBLINGUAL_TABLET | Freq: Every day | SUBLINGUAL | Status: AC

## 2015-03-13 MED ORDER — METHYLPREDNISOLONE ACETATE 40 MG/ML IJ SUSP: 40.0000 mg | Freq: Once | INTRAMUSCULAR | Status: DC

## 2015-04-16 NOTE — Progress Notes (Signed)
Addendum: the patient received 40 mg of Depo Medrol total.  Each trigger point was injected with 1 mL of 2% lidocaine and 10 mg of Depo-Medrol.

## 2015-04-25 ENCOUNTER — Other Ambulatory Visit: Payer: Self-pay | Admitting: Internal Medicine

## 2015-06-19 ENCOUNTER — Ambulatory Visit: Payer: No Typology Code available for payment source | Admitting: Internal Medicine

## 2015-08-27 ENCOUNTER — Other Ambulatory Visit: Payer: Self-pay | Admitting: Internal Medicine

## 2015-10-05 ENCOUNTER — Emergency Department (HOSPITAL_COMMUNITY)
Admission: EM | Admit: 2015-10-05 | Discharge: 2015-10-05 | Disposition: A | Discharge status: Home / Self Care | Payer: Managed Care, Other (non HMO) | Attending: Emergency Medicine | Admitting: Emergency Medicine

## 2015-10-05 ENCOUNTER — Encounter (HOSPITAL_COMMUNITY): Payer: Self-pay | Admitting: *Deleted

## 2015-10-05 ENCOUNTER — Telehealth: Payer: Self-pay | Admitting: Internal Medicine

## 2015-10-05 ENCOUNTER — Telehealth: Payer: Self-pay | Admitting: *Deleted

## 2015-10-05 ENCOUNTER — Emergency Department (HOSPITAL_COMMUNITY): Payer: Managed Care, Other (non HMO)

## 2015-10-05 DIAGNOSIS — R112 Nausea with vomiting, unspecified: Secondary | ICD-10-CM | POA: Diagnosis not present

## 2015-10-05 DIAGNOSIS — Z3202 Encounter for pregnancy test, result negative: Secondary | ICD-10-CM | POA: Diagnosis not present

## 2015-10-05 DIAGNOSIS — Z79899 Other long term (current) drug therapy: Secondary | ICD-10-CM | POA: Diagnosis not present

## 2015-10-05 DIAGNOSIS — M797 Fibromyalgia: Secondary | ICD-10-CM | POA: Diagnosis not present

## 2015-10-05 DIAGNOSIS — J45901 Unspecified asthma with (acute) exacerbation: Secondary | ICD-10-CM | POA: Diagnosis not present

## 2015-10-05 DIAGNOSIS — R079 Chest pain, unspecified: Secondary | ICD-10-CM

## 2015-10-05 DIAGNOSIS — R0789 Other chest pain: Secondary | ICD-10-CM | POA: Insufficient documentation

## 2015-10-05 DIAGNOSIS — Z8719 Personal history of other diseases of the digestive system: Secondary | ICD-10-CM | POA: Insufficient documentation

## 2015-10-05 DIAGNOSIS — E669 Obesity, unspecified: Secondary | ICD-10-CM | POA: Insufficient documentation

## 2015-10-05 LAB — BASIC METABOLIC PANEL
Anion gap: 9 (ref 5–15)
BUN: 9 mg/dL (ref 6–20)
CALCIUM: 9.6 mg/dL (ref 8.9–10.3)
CO2: 25 mmol/L (ref 22–32)
Chloride: 106 mmol/L (ref 101–111)
Creatinine, Ser: 0.77 mg/dL (ref 0.44–1.00)
GFR calc non Af Amer: >60 mL/min (ref >60)
Glucose, Bld: 99 mg/dL (ref 65–99)
Potassium: 4.4 mmol/L (ref 3.5–5.1)
Sodium: 140 mmol/L (ref 135–145)

## 2015-10-05 LAB — I-STAT TROPONIN, ED: TROPONIN I, POC: 0 ng/mL (ref 0.00–0.08)

## 2015-10-05 LAB — CBC
HCT: 37.2 % (ref 36.0–46.0)
Hemoglobin: 11.5 g/dL — ABNORMAL LOW (ref 12.0–15.0)
MCH: 24.6 pg — ABNORMAL LOW (ref 26.0–34.0)
MCHC: 30.9 g/dL (ref 30.0–36.0)
MCV: 79.5 fL (ref 78.0–100.0)
Platelets: 301 10*3/uL (ref 150–400)
RBC: 4.68 MIL/uL (ref 3.87–5.11)
RDW: 14.3 % (ref 11.5–15.5)
WBC: 4.5 10*3/uL (ref 4.0–10.5)

## 2015-10-05 LAB — I-STAT BETA HCG BLOOD, ED (MC, WL, AP ONLY): I-stat hCG, quantitative: <5 m[IU]/mL (ref <5)

## 2015-10-05 MED ORDER — OXYCODONE-ACETAMINOPHEN 5-325 MG PO TABS
ORAL_TABLET | ORAL | Status: AC
  Filled 2015-10-05: qty 1

## 2015-10-05 MED ORDER — TRAMADOL HCL 50 MG PO TABS: 50.0000 mg | ORAL_TABLET | Freq: Two times a day (BID) | ORAL | Status: DC | PRN

## 2015-10-05 MED ORDER — NAPROXEN 500 MG PO TABS: 500.0000 mg | ORAL_TABLET | Freq: Two times a day (BID) | ORAL | Status: DC

## 2015-10-05 MED ORDER — OXYCODONE-ACETAMINOPHEN 5-325 MG PO TABS
1.0000 | ORAL_TABLET | ORAL | Status: DC | PRN
  Administered 2015-10-05: 1 via ORAL

## 2015-10-05 NOTE — Telephone Encounter (Signed)
Done hardcopy to Corinne  

## 2015-10-05 NOTE — Telephone Encounter (Signed)
Left msg on triage requesting refill on her Tramadol & Naproxen...Raechel Chute/lmb

## 2015-10-05 NOTE — ED Notes (Signed)
Pt reports onset last night of mid chest tightness and sharp pain, radiates into her back and reports sob. Had episode of n/v and recent cough. ekg done at triage.

## 2015-10-05 NOTE — Discharge Instructions (Signed)
I recommend continuing to take your home pain meds as prescribed for pain relief. You may also take 600 mg ibuprofen 4 times daily as needed for pain relief. I recommend eating prior to taking her ibuprofen to prevent gastrointestinal side effects. Follow-up with your primary care provider within the next week if your pain has not improved. I recommend following up with the cardiology clinic listed above for further evaluation of your chest pain. Please return to the Emergency Department if symptoms worsen or new onset of fever, difficulty breathing, coughing up blood, lightheadedness, dizziness, syncope, abdominal pain, vomiting, numbness, tingling, weakness, leg swelling.

## 2015-10-05 NOTE — Telephone Encounter (Signed)
Done hardcopy to Corinne- tramadol, and naproxyn done erx

## 2015-10-05 NOTE — ED Provider Notes (Signed)
CSN: [id number]     Arrival date & time 10/05/15  1116 History   First MD Initiated Contact with Patient 10/05/15 1244     Chief Complaint  Patient presents with  . Chest Pain  . Shortness of Breath     (Consider location/radiation/quality/duration/timing/severity/associated sxs/prior Treatment) HPI   Patient is a 35 year old female past medical history of asthma, GERD and fibromyalgia who presents the ED with complaint of chest pain, onset night last night. Patient reports she was laying in bed she began to have constant deep sharp right sided chest pain and tightness. She notes the chest pain worsened overnight. Endorses associated shortness of breath due to chest pain. She notes she had an episode of nausea and vomiting this morning due to the severity of pain. Endorses associated dry cough. Patient notes she has had chest pain in the past related to her fibromyalgia however she notes that this pain feels similar but more severe. Patient reports taking her home pain meds (Ultram) without relief of symptoms. Denies fever, chills, headache, lightheadedness, dizziness, visual changes, hemoptysis, wheezing, abdominal pain, numbness, tingling, weakness. Patient denies any recent surgeries, hx of DTV/PT, hx of cancer, air travel, lower extremity swelling or use of exogenous estrogen. She notes she had a Mirena removed approximately one month ago.  Past Medical History  Diagnosis Date  . Abdominal pain, unspecified site 10/10/2009  . 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 ALLERGIC RHINITIS 08/19/2007  . ASTHMA 08/19/2007  . CONSTIPATION 10/29/2009  . DIZZINESS 02/13/2010  . GASTROENTERITIS 08/19/2007  . GERD 08/19/2007  . HEMATOCHEZIA 03/10/2008  . MYALGIA 10/10/2009  . OBESITY, MORBID 08/19/2007  . PERIPHERAL EDEMA 10/10/2009  . Fibromyalgia 01/16/2011   Past Surgical History  Procedure Laterality Date  . Cholecystectomy    . Esophagogastroduodenoscopy  02/29/2004   Family History  Problem Relation Age of Onset   . Diabetes Other   . Hypertension Other   . Heart disease Other    Social History  Substance Use Topics  . Smoking status: Never Smoker   . Smokeless tobacco: None  . Alcohol Use: Yes     Comment: occ   OB History    No data available     Review of Systems  Respiratory: Positive for cough (dry) and shortness of breath (due to pain).   Cardiovascular: Positive for chest pain.  Gastrointestinal: Positive for nausea and vomiting.  All other systems reviewed and are negative.     Allergies  Sulfonamide derivatives  Home Medications   Prior to Admission medications   Medication Sig Start Date End Date Taking? Authorizing Provider  Cholecalciferol (VITAMIN D3) 2000 UNITS capsule Take 1 capsule (2,000 Units total) by mouth daily. 03/02/15  Yes Aleksei Plotnikov V, MD  Cyanocobalamin (VITAMIN B-12) 1000 MCG SUBL Place 1 tablet (1,000 mcg total) under the tongue daily. 03/05/15  Yes Aleksei Plotnikov V, MD  cyclobenzaprine (FLEXERIL) 5 MG tablet Take 1 tablet (5 mg total) by mouth at bedtime. 03/02/15  Yes Aleksei Plotnikov V, MD  DULoxetine (CYMBALTA) 60 MG capsule Take 1 capsule (60 mg total) by mouth daily. 11/06/14  Yes Corwin Levins, MD  furosemide (LASIX) 40 MG tablet TAKE 1 TABLET BY MOUTH EVERY DAY 04/25/15  Yes Corwin Levins, MD  HYDROcodone-acetaminophen (NORCO/VICODIN) 5-325 MG tablet Take 1-2 tablets by mouth every 6 (six) hours as needed for severe pain. 02/26/15  Yes John Molpus, MD  nabumetone (RELAFEN) 750 MG tablet Take 1 tablet (750 mg total) by mouth 2 (two) times daily.  03/02/15  Yes Aleksei Plotnikov V, MD  traMADol (ULTRAM) 50 MG tablet Take 50 mg by mouth every 6 (six) hours as needed for moderate pain.   Yes Historical Provider, MD  Vitamin D, Ergocalciferol, (DRISDOL) 50000 units CAPS capsule TAKE 1 CAPSULE (50,000 UNITS TOTAL) BY MOUTH ONCE A WEEK. 08/28/15  Yes Corwin Levins, MD   BP 104/69 mmHg  Pulse 68  Temp(Src) 97.5 F (36.4 C) (Oral)  Resp 14  SpO2 98%   LMP 09/28/2015 Physical Exam  Constitutional: She is oriented to person, place, and time. She appears well-developed and well-nourished. No distress.  Pt laying in bed with eyes closed and sheet over her head.  HENT:  Head: Normocephalic and atraumatic.  Mouth/Throat: Oropharynx is clear and moist. No oropharyngeal exudate.  Eyes: Conjunctivae and EOM are normal. Right eye exhibits no discharge. Left eye exhibits no discharge. No scleral icterus.  Neck: Normal range of motion. Neck supple.  Cardiovascular: Normal rate, regular rhythm, normal heart sounds and intact distal pulses.   Pulmonary/Chest: Effort normal and breath sounds normal. No respiratory distress. She has no wheezes. She has no rales. She exhibits tenderness (right anterior chest wall). She exhibits no laceration, no crepitus, no edema, no deformity, no swelling and no retraction.  Abdominal: Soft. Bowel sounds are normal. She exhibits no distension and no mass. There is no tenderness. There is no rebound and no guarding.  Musculoskeletal: Normal range of motion. She exhibits no edema.  Lymphadenopathy:    She has no cervical adenopathy.  Neurological: She is alert and oriented to person, place, and time.  Skin: Skin is warm and dry. She is not diaphoretic.  Nursing note and vitals reviewed.   ED Course  Procedures (including critical care time) Labs Review Labs Reviewed  CBC - Abnormal; Notable for the following:    Hemoglobin 11.5 (*)    MCH 24.6 (*)    All other components within normal limits  BASIC METABOLIC PANEL  I-STAT TROPOININ, ED  I-STAT BETA HCG BLOOD, ED ([facility], [facility], [facility] ONLY)    Imaging Review Dg Chest 2 View  10/05/2015  CLINICAL DATA:  Onset severe mid chest pain resulting in shortness of breath 1 day ago. Initial encounter. No known injury. EXAM: CHEST  2 VIEW COMPARISON:  PA and lateral chest 10/14/2013 and 08/02/2009. FINDINGS: The lungs are clear. Heart size is normal. No pneumothorax or pleural  effusion. Thoracolumbar scoliosis is noted. The patient is status post cholecystectomy. IMPRESSION: No acute disease. Scoliosis. Electronically Signed   By: Drusilla Kanner M.D.   On: 10/05/2015 11:58   I have personally reviewed and evaluated these images and lab results as part of my medical decision-making.   EKG Interpretation   Date/Time:  Friday Oct 05 2015 11:21:01 EDT Ventricular Rate:  83 PR Interval:  148 QRS Duration: 74 QT Interval:  374 QTC Calculation: 439 R Axis:   60 Text Interpretation:  Normal sinus rhythm Normal ECG No significant change  since last tracing Confirmed by Anitra Lauth  MD, Alphonzo Lemmings ([id number]) on 10/05/2015  3:30:15 PM      MDM   Final diagnoses:  Chest pain, unspecified chest pain type   Pt presents with constant right sided chest pain that started last night around midnight while laying in bed. Endorses associated shortness of breath, nausea, vomiting and dry cough. She reports having similar less severe chest pain in the past related to her fibromyalgia. VSS. Exam revealed right anterior chest wall tender palpation, remaining exam unremarkable.  Patient given Percocet prior to my initial evaluation. She reports her chest pain has improved. PERC negative. HEART score 1, due to family hx of cardiac disease. EKG showed NSR with no changes from prior. CXR negative. Trop negative.  Labs unremarkable. I have a low suspicion for ACS, PE, dissection, or other acute cardiac event at this time. I suspect pt's pain is likely musculoskeletal in etiology. On reevaluation patient is sleeping and resting in bed comfortably. She denies having any current pain at this time. Discussed results and plan for discharge with patient. Bandage discharge patient home with symptomatic treatment and outpatient follow-up. Discussed return precautions with patient.  Satira Sarkicole Elizabeth Del DiosNadeau, [place] JerseyPA-C 10/05/15 1542  Gwyneth SproutWhitney Plunkett, MD 10/07/15 2025

## 2015-10-08 NOTE — Telephone Encounter (Signed)
Faxed script for Tramadol to [facility].../lmb

## 2015-10-17 ENCOUNTER — Other Ambulatory Visit: Payer: Self-pay | Admitting: Internal Medicine

## 2015-10-24 ENCOUNTER — Other Ambulatory Visit: Payer: Self-pay | Admitting: Internal Medicine

## 2015-11-05 ENCOUNTER — Other Ambulatory Visit: Payer: Self-pay | Admitting: Internal Medicine

## 2015-12-26 ENCOUNTER — Ambulatory Visit: Payer: Managed Care, Other (non HMO) | Admitting: Family

## 2015-12-28 ENCOUNTER — Ambulatory Visit: Payer: Managed Care, Other (non HMO) | Admitting: Internal Medicine

## 2016-01-01 ENCOUNTER — Encounter: Payer: Self-pay | Admitting: Internal Medicine

## 2016-01-01 ENCOUNTER — Ambulatory Visit (INDEPENDENT_AMBULATORY_CARE_PROVIDER_SITE_OTHER): Payer: Managed Care, Other (non HMO) | Admitting: Internal Medicine

## 2016-01-01 VITALS — BP 116/72 | HR 96 | Temp 98.0°F | Resp 20 | Wt 249.0 lb

## 2016-01-01 DIAGNOSIS — M797 Fibromyalgia: Secondary | ICD-10-CM

## 2016-01-01 DIAGNOSIS — M542 Cervicalgia: Secondary | ICD-10-CM

## 2016-01-01 DIAGNOSIS — M545 Low back pain, unspecified: Secondary | ICD-10-CM | POA: Insufficient documentation

## 2016-01-01 MED ORDER — PREDNISONE 10 MG PO TABS
ORAL_TABLET | ORAL | 0 refills | Status: DC
Start: 2016-01-01 — End: 2016-05-02

## 2016-01-01 MED ORDER — TRAMADOL HCL 50 MG PO TABS: 50.0000 mg | ORAL_TABLET | Freq: Two times a day (BID) | ORAL | 0 refills | Status: DC | PRN

## 2016-01-01 MED ORDER — IBUPROFEN 600 MG PO TABS: 600.0000 mg | ORAL_TABLET | Freq: Three times a day (TID) | ORAL | 1 refills | Status: AC | PRN

## 2016-01-01 NOTE — Patient Instructions (Signed)
Please take all new medication as prescribed - the ultram, ibuprofen, and prednisone  Please continue all other medications as before, and refills have been done if requested.  Please have the pharmacy call with any other refills you may need.  Please keep your appointments with your specialists as you may have planned
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
## 2016-01-01 NOTE — Progress Notes (Signed)
Pre visit review using our clinic review tool, if applicable. No additional management support is needed unless otherwise documented below in the visit note. 

## 2016-01-06 NOTE — Assessment & Plan Note (Signed)
C/w msk strain, for pain control, muscle relaxer prn, predpac asd,  to f/u any worsening symptoms or concerns  

## 2016-01-06 NOTE — Assessment & Plan Note (Signed)
C/w msk strain, for pain control, muscle relaxer prn, predpac asd,  to f/u any worsening symptoms or concerns

## 2016-01-06 NOTE — Assessment & Plan Note (Signed)
To continue cymbalta asd,  to f/u any worsening symptoms or concerns

## 2016-04-22 ENCOUNTER — Other Ambulatory Visit: Payer: Self-pay | Admitting: Internal Medicine

## 2016-04-30 ENCOUNTER — Telehealth: Payer: Self-pay | Admitting: *Deleted

## 2016-04-30 MED ORDER — CYCLOBENZAPRINE HCL 5 MG PO TABS: 5.0000 mg | ORAL_TABLET | Freq: Every day | ORAL | 2 refills | Status: DC

## 2016-04-30 NOTE — Telephone Encounter (Signed)
Ok for flexeril prn, but I would not feel comfortable with tramadol for long term chronic pain for this patient given the last exam

## 2016-04-30 NOTE — Telephone Encounter (Signed)
Patient states she is also needing flexeril

## 2016-04-30 NOTE — Telephone Encounter (Signed)
rec'd call pt is requesting refill on her Tramadol...Raechel Chute/lmb

## 2016-05-01 NOTE — Telephone Encounter (Signed)
Notified pt w/MD response. Pt made f/u appt to discuss fibromyalgia pain...Raechel Chute/lmb

## 2016-05-02 ENCOUNTER — Encounter: Payer: Self-pay | Admitting: Internal Medicine

## 2016-05-02 ENCOUNTER — Ambulatory Visit (INDEPENDENT_AMBULATORY_CARE_PROVIDER_SITE_OTHER): Payer: Managed Care, Other (non HMO) | Admitting: Internal Medicine

## 2016-05-02 VITALS — BP 138/72 | HR 90 | Resp 20 | Wt 252.0 lb

## 2016-05-02 DIAGNOSIS — M797 Fibromyalgia: Secondary | ICD-10-CM

## 2016-05-02 DIAGNOSIS — D649 Anemia, unspecified: Secondary | ICD-10-CM

## 2016-05-02 DIAGNOSIS — Z0000 Encounter for general adult medical examination without abnormal findings: Secondary | ICD-10-CM | POA: Diagnosis not present

## 2016-05-02 MED ORDER — TRAMADOL HCL 50 MG PO TABS: 50.0000 mg | ORAL_TABLET | Freq: Two times a day (BID) | ORAL | 1 refills | Status: AC | PRN

## 2016-05-02 MED ORDER — METHOCARBAMOL 500 MG PO TABS: 500.0000 mg | ORAL_TABLET | Freq: Four times a day (QID) | ORAL | 2 refills | Status: AC

## 2016-05-02 NOTE — Progress Notes (Signed)
Subjective:    Patient ID: Lori PigeonLatosha D Carpenter, female    DOB: 16-Mar-1981, 35 y.o.   MRN: [id number]  HPI  Here for wellness and f/u;  Overall doing ok;  Pt denies Chest pain, worsening SOB, DOE, wheezing, orthopnea, PND, worsening LE edema, palpitations, dizziness or syncope.  Pt denies neurological change such as new headache, facial or extremity weakness.  Pt denies polydipsia, polyuria, or low sugar symptoms. Pt states overall good compliance with treatment and medications, good tolerability, and has been trying to follow appropriate diet.  Pt denies worsening depressive symptoms, suicidal ideation or panic. No fever, night sweats, wt loss, loss of appetite, or other constitutional symptoms.  Pt states good ability with ADL's, has low fall risk, home safety reviewed and adequate, no other significant changes in hearing or vision, and only occasionally active with exercise. Has had 2 wks flare of diffuse back pain mostly upper and lower back bilateral symmetric, seems also sometimes in the shoulders, hips and legs.  Couldn't sleep last night.  Good compliance with multiple meds - cymbalta, and ibuprofen and tramadol (now out), also out of flexeril but does not work well, asks for change.  No overt bleeding recenlty or heavy or irregular periods.  Does c/o ongoing fatigue, but denies signficant daytime hypersomnolence.  No other new history changes Past Medical History:  Diagnosis Date  . Abdominal pain, unspecified site 10/10/2009  . 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 ALLERGIC RHINITIS 08/19/2007  . ASTHMA 08/19/2007  . CONSTIPATION 10/29/2009  . DIZZINESS 02/13/2010  . Fibromyalgia 01/16/2011  . GASTROENTERITIS 08/19/2007  . GERD 08/19/2007  . HEMATOCHEZIA 03/10/2008  . MYALGIA 10/10/2009  . OBESITY, MORBID 08/19/2007  . PERIPHERAL EDEMA 10/10/2009   Past Surgical History:  Procedure Laterality Date  . CHOLECYSTECTOMY    . ESOPHAGOGASTRODUODENOSCOPY  02/29/2004    reports that she has never smoked. She does not  have any smokeless tobacco history on file. She reports that she drinks alcohol. She reports that she does not use drugs. family history includes Diabetes in her other; Heart disease in her other; Hypertension in her other. Allergies  Allergen Reactions  . Sulfonamide Derivatives Anaphylaxis   Current Outpatient Prescriptions on File Prior to Visit  Medication Sig Dispense Refill  . Cholecalciferol (VITAMIN D3) 2000 UNITS capsule Take 1 capsule (2,000 Units total) by mouth daily. 100 capsule 3  . Cyanocobalamin (VITAMIN B-12) 1000 MCG SUBL Place 1 tablet (1,000 mcg total) under the tongue daily. 100 tablet 3  . DULoxetine (CYMBALTA) 60 MG capsule TAKE ONE CAPSULE BY MOUTH EVERY DAY 90 capsule 2  . furosemide (LASIX) 40 MG tablet TAKE 1 TABLET BY MOUTH EVERY DAY 90 tablet 1  . ibuprofen (ADVIL,MOTRIN) 600 MG tablet Take 1 tablet (600 mg total) by mouth every 8 (eight) hours as needed. 60 tablet 1  . Vitamin D, Ergocalciferol, (DRISDOL) 50000 units CAPS capsule TAKE 1 CAPSULE (50,000 UNITS TOTAL) BY MOUTH ONCE A WEEK. 6 capsule 0   No current facility-administered medications on file prior to visit.     Review of Systems Constitutional: Negative for increased diaphoresis, or other activity, appetite or siginficant weight change other than noted HENT: Negative for worsening hearing loss, ear pain, facial swelling, mouth sores and neck stiffness.   Eyes: Negative for other worsening pain, redness or visual disturbance.  Respiratory: Negative for choking or stridor Cardiovascular: Negative for other chest pain and palpitations.  Gastrointestinal: Negative for worsening diarrhea, blood in stool, or abdominal distention Genitourinary: Negative for hematuria,  flank pain or change in urine volume.  Musculoskeletal: Negative for other joint complaints.  Skin: Negative for other color change and wound or drainage.  Neurological: Negative for syncope and numbness. other than noted Hematological:  Negative for adenopathy. or other swelling Psychiatric/Behavioral: Negative for hallucinations, SI, self-injury, decreased concentration or other worsening agitation.  ALl other system neg per pt    Objective:   Physical Exam BP 138/72   Pulse 90   Resp 20   Wt 252 lb (114.3 kg)   SpO2 98%   BMI 39.47 kg/m  VS noted,  Constitutional: Pt appears in no apparent distress HENT: Head: NCAT.  Right Ear: External ear normal.  Left Ear: External ear normal.  Eyes: . Pupils are equal, round, and reactive to light. Conjunctivae and EOM are normal Neck: Normal range of motion. Neck supple.  Cardiovascular: Normal rate and regular rhythm.   Pulmonary/Chest: Effort normal and breath sounds without rales or wheezing.  Abd:  Soft, NT, ND, + BS MSK: no joint  Effusions, has diffuse trigger point tender areas symmetric to upper and lower back, spine nontender midline Neurological: Pt is alert. Not confused , motor grossly intact Skin: Skin is warm. No rash, no LE edema Psychiatric: Pt behavior is normal. No agitation.  No other new exam findings    Assessment & Plan:

## 2016-05-02 NOTE — Patient Instructions (Signed)
Ok to change the flexeril to robaxin as prescribed for muscle relaxer  Please continue all other medications as before, and refills have been done if requested - the tramadol  Please have the pharmacy call with any other refills you may need.  Please continue your efforts at being more active, low cholesterol diet, and weight control.  Please keep your appointments with your specialists as you may have planned  Please go to the LAB in the Basement (turn left off the elevator) for the tests to be done today or at your convenience  You will be contacted by phone if any changes need to be made immediately.  Otherwise, you will receive a letter about your results with an explanation, but please check with MyChart first.  Please remember to sign up for MyChart if you have not done so, as this will be important to you in the future with finding out test results, communicating by private email, and scheduling acute appointments online when needed.

## 2016-05-02 NOTE — Progress Notes (Signed)
Pre visit review using our clinic review tool, if applicable. No additional management support is needed unless otherwise documented below in the visit note. 

## 2016-05-03 NOTE — Assessment & Plan Note (Signed)
Lab Results  Component Value Date   WBC 4.5 10/05/2015   HGB 11.5 (L) 10/05/2015   HCT 37.2 10/05/2015   MCV 79.5 10/05/2015   PLT 301 10/05/2015   For f/u iron/ cbc

## 2016-05-03 NOTE — Assessment & Plan Note (Signed)

## 2016-05-03 NOTE — Assessment & Plan Note (Signed)
With recent flare subjective per pt now mod to severe, for change flexeril to robaxin prn, and tramadol limited rx prn, cont cymbalta/ibuprofen

## 2016-05-29 ENCOUNTER — Encounter (HOSPITAL_BASED_OUTPATIENT_CLINIC_OR_DEPARTMENT_OTHER): Payer: Self-pay | Admitting: *Deleted

## 2016-05-29 ENCOUNTER — Emergency Department (HOSPITAL_BASED_OUTPATIENT_CLINIC_OR_DEPARTMENT_OTHER)
Admission: EM | Admit: 2016-05-29 | Discharge: 2016-05-29 | Disposition: A | Discharge status: Home / Self Care | Payer: Managed Care, Other (non HMO) | Attending: Emergency Medicine | Admitting: Emergency Medicine

## 2016-05-29 DIAGNOSIS — J45909 Unspecified asthma, uncomplicated: Secondary | ICD-10-CM | POA: Insufficient documentation

## 2016-05-29 DIAGNOSIS — R0981 Nasal congestion: Secondary | ICD-10-CM | POA: Diagnosis not present

## 2016-05-29 DIAGNOSIS — Z79899 Other long term (current) drug therapy: Secondary | ICD-10-CM | POA: Insufficient documentation

## 2016-05-29 DIAGNOSIS — J029 Acute pharyngitis, unspecified: Secondary | ICD-10-CM | POA: Diagnosis present

## 2016-05-29 DIAGNOSIS — Z91010 Allergy to peanuts: Secondary | ICD-10-CM | POA: Diagnosis not present

## 2016-05-29 DIAGNOSIS — R067 Sneezing: Secondary | ICD-10-CM | POA: Diagnosis not present

## 2016-05-29 DIAGNOSIS — M791 Myalgia: Secondary | ICD-10-CM | POA: Insufficient documentation

## 2016-05-29 DIAGNOSIS — J3489 Other specified disorders of nose and nasal sinuses: Secondary | ICD-10-CM | POA: Insufficient documentation

## 2016-05-29 DIAGNOSIS — R05 Cough: Secondary | ICD-10-CM | POA: Diagnosis not present

## 2016-05-29 DIAGNOSIS — J111 Influenza due to unidentified influenza virus with other respiratory manifestations: Secondary | ICD-10-CM

## 2016-05-29 DIAGNOSIS — R69 Illness, unspecified: Secondary | ICD-10-CM

## 2016-05-29 LAB — RAPID STREP SCREEN (MED CTR MEBANE ONLY): Streptococcus, Group A Screen (Direct): NEGATIVE

## 2016-05-29 MED ORDER — CETIRIZINE HCL 10 MG PO TABS: 10.0000 mg | ORAL_TABLET | Freq: Every day | ORAL | 1 refills | Status: AC

## 2016-05-29 MED ORDER — BENZONATATE 100 MG PO CAPS: 100.0000 mg | ORAL_CAPSULE | Freq: Three times a day (TID) | ORAL | 0 refills | Status: DC

## 2016-05-29 MED ORDER — FLUTICASONE PROPIONATE 50 MCG/ACT NA SUSP: 2.0000 | Freq: Every day | NASAL | 0 refills | Status: AC

## 2016-05-29 MED ORDER — NAPROXEN 500 MG PO TABS: 500.0000 mg | ORAL_TABLET | Freq: Two times a day (BID) | ORAL | 0 refills | Status: AC

## 2016-05-29 MED ORDER — ACETAMINOPHEN 325 MG PO TABS
650.0000 mg | ORAL_TABLET | Freq: Once | ORAL | Status: AC
  Administered 2016-05-29: 650 mg via ORAL
  Filled 2016-05-29: qty 2

## 2016-05-29 MED ORDER — OSELTAMIVIR PHOSPHATE 75 MG PO CAPS: 75.0000 mg | ORAL_CAPSULE | Freq: Two times a day (BID) | ORAL | 0 refills | Status: DC

## 2016-05-29 MED FILL — ALL DAY ALLERGY 10 MG TAB: 10 | 100 days supply | Qty: 100 | Fill #0

## 2016-05-29 MED FILL — BENZONATATE 100 MG CAPSULE: 100 | 7 days supply | Qty: 21 | Fill #0

## 2016-05-29 MED FILL — FLUTICASONE PROP 50 MCG SPR: 50 | 30 days supply | Qty: 16 | Fill #0

## 2016-05-29 MED FILL — NAPROXEN 500 MG TABLET: 500 | 15 days supply | Qty: 30 | Fill #0

## 2016-05-29 NOTE — ED Triage Notes (Signed)
Pt reports 2 days of non-productive cough, chills, bodyaches, subjective fever, ear pain

## 2016-05-29 NOTE — ED Provider Notes (Signed)
[facility] Note   CSN: [id number] Arrival date & time: 05/29/16  1045     History   Chief Complaint Chief Complaint  Patient presents with  . Generalized Body Aches  . Cough    HPI Lori Carpenter is a 36 y.o. female.  Lori Carpenter is a 36 y.o. Female who presents to the emergency department complaining of 2 days of sneezing, sore throat, nasal congestion, cough, ear pain, chills and body aches. She reports her symptoms began with a sore throat. She reports taking some naproxen this morning with little relief of her symptoms. She denies any shortness of breath. No trouble swallowing. She did not receive her flu shot this year. Patient denies abdominal pain, shortness of breath, nausea, vomiting, diarrhea, rashes, ear discharge.    The history is provided by the patient. No language interpreter was used.  Cough  Associated symptoms include ear pain, rhinorrhea, sore throat and myalgias. Pertinent negatives include no chest pain, no chills, no headaches, no shortness of breath and no wheezing.    Past Medical History:  Diagnosis Date  . Abdominal pain, unspecified site 10/10/2009  . 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 ALLERGIC RHINITIS 08/19/2007  . ASTHMA 08/19/2007  . CONSTIPATION 10/29/2009  . DIZZINESS 02/13/2010  . Fibromyalgia 01/16/2011  . GASTROENTERITIS 08/19/2007  . GERD 08/19/2007  . HEMATOCHEZIA 03/10/2008  . MYALGIA 10/10/2009  . OBESITY, MORBID 08/19/2007  . PERIPHERAL EDEMA 10/10/2009    Patient Active Problem List   Diagnosis Date Noted  . Anemia 05/02/2016  . Right low back pain 01/01/2016  . Neck pain on right side 03/02/2015  . Masses of both breasts 11/29/2014  . Eustachian tube dysfunction 06/15/2014  . Peripheral edema 12/14/2012  . Fibromyalgia 01/16/2011  . Preventative health care 11/05/2010  . Hypersomnia 11/05/2010  . Anxiety and depression 11/05/2010  . Fatigue 11/05/2010  . DIZZINESS 02/13/2010  . CONSTIPATION 10/29/2009  .  MYALGIA 10/10/2009  . Abdominal pain, unspecified site 10/10/2009  . HEMATOCHEZIA 03/10/2008  . OBESITY, MORBID 08/19/2007  . ALLERGIC RHINITIS 08/19/2007  . Asthma 08/19/2007  . 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 GERD 08/19/2007    Past Surgical History:  Procedure Laterality Date  . CHOLECYSTECTOMY    . ESOPHAGOGASTRODUODENOSCOPY  02/29/2004    OB History    No data available       Home Medications    Prior to Admission medications   Medication Sig Start Date End Date Taking? Authorizing Provider  Cyanocobalamin (VITAMIN B-12) 1000 MCG SUBL Place 1 tablet (1,000 mcg total) under the tongue daily. 03/05/15  Yes Tresa Garter, MD  DULoxetine (CYMBALTA) 60 MG capsule TAKE ONE CAPSULE BY MOUTH EVERY DAY 10/17/15  Yes Corwin Levins, MD  furosemide (LASIX) 40 MG tablet TAKE 1 TABLET BY MOUTH EVERY DAY 04/22/16  Yes Corwin Levins, MD  ibuprofen (ADVIL,MOTRIN) 600 MG tablet Take 1 tablet (600 mg total) by mouth every 8 (eight) hours as needed. 01/01/16  Yes Corwin Levins, MD  methocarbamol (ROBAXIN) 500 MG tablet Take 1 tablet (500 mg total) by mouth 4 (four) times daily. 05/02/16  Yes Corwin Levins, MD  traMADol (ULTRAM) 50 MG tablet Take 1 tablet (50 mg total) by mouth 2 (two) times daily as needed. 05/02/16  Yes Corwin Levins, MD  benzonatate (TESSALON) 100 MG capsule Take 1 capsule (100 mg total) by mouth every 8 (eight) hours. 05/29/16   Everlene Farrier, PA-C  cetirizine (ZYRTEC ALLERGY) 10 MG tablet Take 1  tablet (10 mg total) by mouth daily. 05/29/16   Everlene FarrierWilliam Ardella Chhim, PA-C  Cholecalciferol (VITAMIN D3) 2000 UNITS capsule Take 1 capsule (2,000 Units total) by mouth daily. 03/02/15   Georgina QuintAleksei V Plotnikov, MD  fluticasone (FLONASE) 50 MCG/ACT nasal spray Place 2 sprays into both nostrils daily. 05/29/16   Everlene FarrierWilliam Charlisha Market, PA-C  naproxen (NAPROSYN) 500 MG tablet Take 1 tablet (500 mg total) by mouth 2 (two) times daily with a meal. 05/29/16   Everlene FarrierWilliam Jontae Adebayo, PA-C  oseltamivir (TAMIFLU) 75 MG capsule Take 1  capsule (75 mg total) by mouth every 12 (twelve) hours. 05/29/16   Everlene FarrierWilliam Maize Brittingham, PA-C  Vitamin D, Ergocalciferol, (DRISDOL) 50000 units CAPS capsule TAKE 1 CAPSULE (50,000 UNITS TOTAL) BY MOUTH ONCE A WEEK. 11/05/15   Corwin LevinsJames W John, MD    Family History Family History  Problem Relation Age of Onset  . Diabetes Other   . Hypertension Other   . Heart disease Other     Social History Social History  Substance Use Topics  . Smoking status: Never Smoker  . Smokeless tobacco: Never Used  . Alcohol use Yes     Comment: occ     Allergies   Cabbage; Other; Peanut-containing drug products; Sulfonamide derivatives; and Fish allergy   Review of Systems Review of Systems  Constitutional: Negative for chills and fever.  HENT: Positive for congestion, ear pain, postnasal drip, rhinorrhea, sinus pressure, sneezing and sore throat. Negative for ear discharge, nosebleeds, trouble swallowing and voice change.   Eyes: Negative for visual disturbance.  Respiratory: Positive for cough. Negative for shortness of breath and wheezing.   Cardiovascular: Negative for chest pain and palpitations.  Gastrointestinal: Negative for abdominal pain, diarrhea, nausea and vomiting.  Genitourinary: Negative for dysuria.  Musculoskeletal: Positive for myalgias. Negative for back pain and neck pain.  Skin: Negative for rash.  Neurological: Negative for syncope, light-headedness and headaches.     Physical Exam Updated Vital Signs BP 118/79 (BP Location: Left Arm)   Pulse 103   Temp 99.1 F (37.3 C) (Oral)   Resp 20   LMP 04/30/2016   Physical Exam  Constitutional: She appears well-developed and well-nourished. No distress.  Nontoxic appearing.  HENT:  Head: Normocephalic and atraumatic.  Right Ear: External ear normal.  Left Ear: External ear normal.  Mouth/Throat: No oropharyngeal exudate.  Moderate middle ear effusion noted bilaterally without TM erythema or loss of landmarks. Rhinorrhea present.  Boggy nasal terminates bilaterally. Mild bilateral tonsillar hypertrophy without exudates. Uvula is midline without edema. No peritonsillar abscess. No trismus. No drooling.  Eyes: Conjunctivae are normal. Pupils are equal, round, and reactive to light. Right eye exhibits no discharge. Left eye exhibits no discharge.  Neck: Normal range of motion. Neck supple.  Cardiovascular: Normal rate, regular rhythm, normal heart sounds and intact distal pulses.  Exam reveals no gallop and no friction rub.   No murmur heard. HR is 88.   Pulmonary/Chest: Effort normal and breath sounds normal. No stridor. No respiratory distress. She has no wheezes. She has no rales.  Lungs are clear to auscultation bilaterally. No increased work of breathing. No rales or rhonchi. No wheezing.  Abdominal: Soft. There is no tenderness. There is no guarding.  Musculoskeletal: She exhibits no edema.  Lymphadenopathy:    She has no cervical adenopathy.  Neurological: She is alert. Coordination normal.  Skin: Skin is warm and dry. Capillary refill takes less than 2 seconds. No rash noted. She is not diaphoretic. No erythema. No pallor.  Psychiatric:  She has a normal mood and affect. Her behavior is normal.  Nursing note and vitals reviewed.    ED Treatments / Results  Labs (all labs ordered are listed, but only abnormal results are displayed) Labs Reviewed  RAPID STREP SCREEN (NOT AT [facility])  CULTURE, GROUP A STREP [facility])    EKG  EKG Interpretation None       Radiology No results found.  Procedures Procedures (including critical care time)  Medications Ordered in ED Medications  acetaminophen (TYLENOL) tablet 650 mg (650 mg Oral Given 05/29/16 1149)     Initial Impression / Assessment and Plan / ED Course  I have reviewed the triage vital signs and the nursing notes.  Pertinent labs & imaging results that were available during my care of the patient were reviewed by me and considered in my medical  decision making (see chart for details).  Clinical Course    This is a 36 y.o. Female who presents to the emergency department complaining of 2 days of sneezing, sore throat, nasal congestion, cough, ear pain, chills and body aches. She reports her symptoms began with a sore throat. She reports taking some naproxen this morning with little relief of her symptoms. She denies any shortness of breath. No trouble swallowing. She did not receive her flu shot this year. On exam the patient is afebrile nontoxic appearing. Heart rate is 88. Lungs are clear to auscultation bilaterally. No wheezing. No rales or rhonchi. Patient with mild middle ear effusion bilaterally, boggy nasal turbinates and evidence of postnasal drip. No tonsillar hypertrophy or exudates. Rapid strep obtained due to patient reporting sore throat at onset. Rapid strep is negative. I suspect patient with influenza-like illness. She has body aches, chills and URI-like symptoms. I discussed Tamiflu and its indications. Patient would like to try Tamiflu. Loss of right ear with prescriptions for Zyrtec, Flonase, Tessalon Perles and naproxen. I discussed expected course and treatment of the flu. Discussed return precautions. I see no need for chest x-ray at this time as the patient is afebrile and lungs are CTA. Patient agrees with plan. I advised the patient to follow-up with their primary care provider this week. I advised the patient to return to the emergency department with new or worsening symptoms or new concerns. The patient verbalized understanding and agreement with plan.      Final Clinical Impressions(s) / ED Diagnoses   Final diagnoses:  Influenza-like illness    New Prescriptions New Prescriptions   BENZONATATE (TESSALON) 100 MG CAPSULE    Take 1 capsule (100 mg total) by mouth every 8 (eight) hours.   CETIRIZINE (ZYRTEC ALLERGY) 10 MG TABLET    Take 1 tablet (10 mg total) by mouth daily.   FLUTICASONE (FLONASE) 50 MCG/ACT  NASAL SPRAY    Place 2 sprays into both nostrils daily.   NAPROXEN (NAPROSYN) 500 MG TABLET    Take 1 tablet (500 mg total) by mouth 2 (two) times daily with a meal.   OSELTAMIVIR (TAMIFLU) 75 MG CAPSULE    Take 1 capsule (75 mg total) by mouth every 12 (twelve) hours.     Everlene Farrier, PA-C 05/29/16 1304    Maia Plan, MD 05/30/16 1027

## 2016-05-31 LAB — CULTURE, GROUP A STREP (THRC)

## 2016-06-10 IMAGING — DX DG CHEST 2V
2 series · 2 of 2 positions shown · non-contrast
Comparison: PA and lateral chest 10/14/2013 and 08/02/2009.

CLINICAL DATA: Onset severe mid chest pain resulting in shortness
of breath 1 day ago. Initial encounter. No known injury.

EXAM:
CHEST  2 VIEW

[w chest pa]
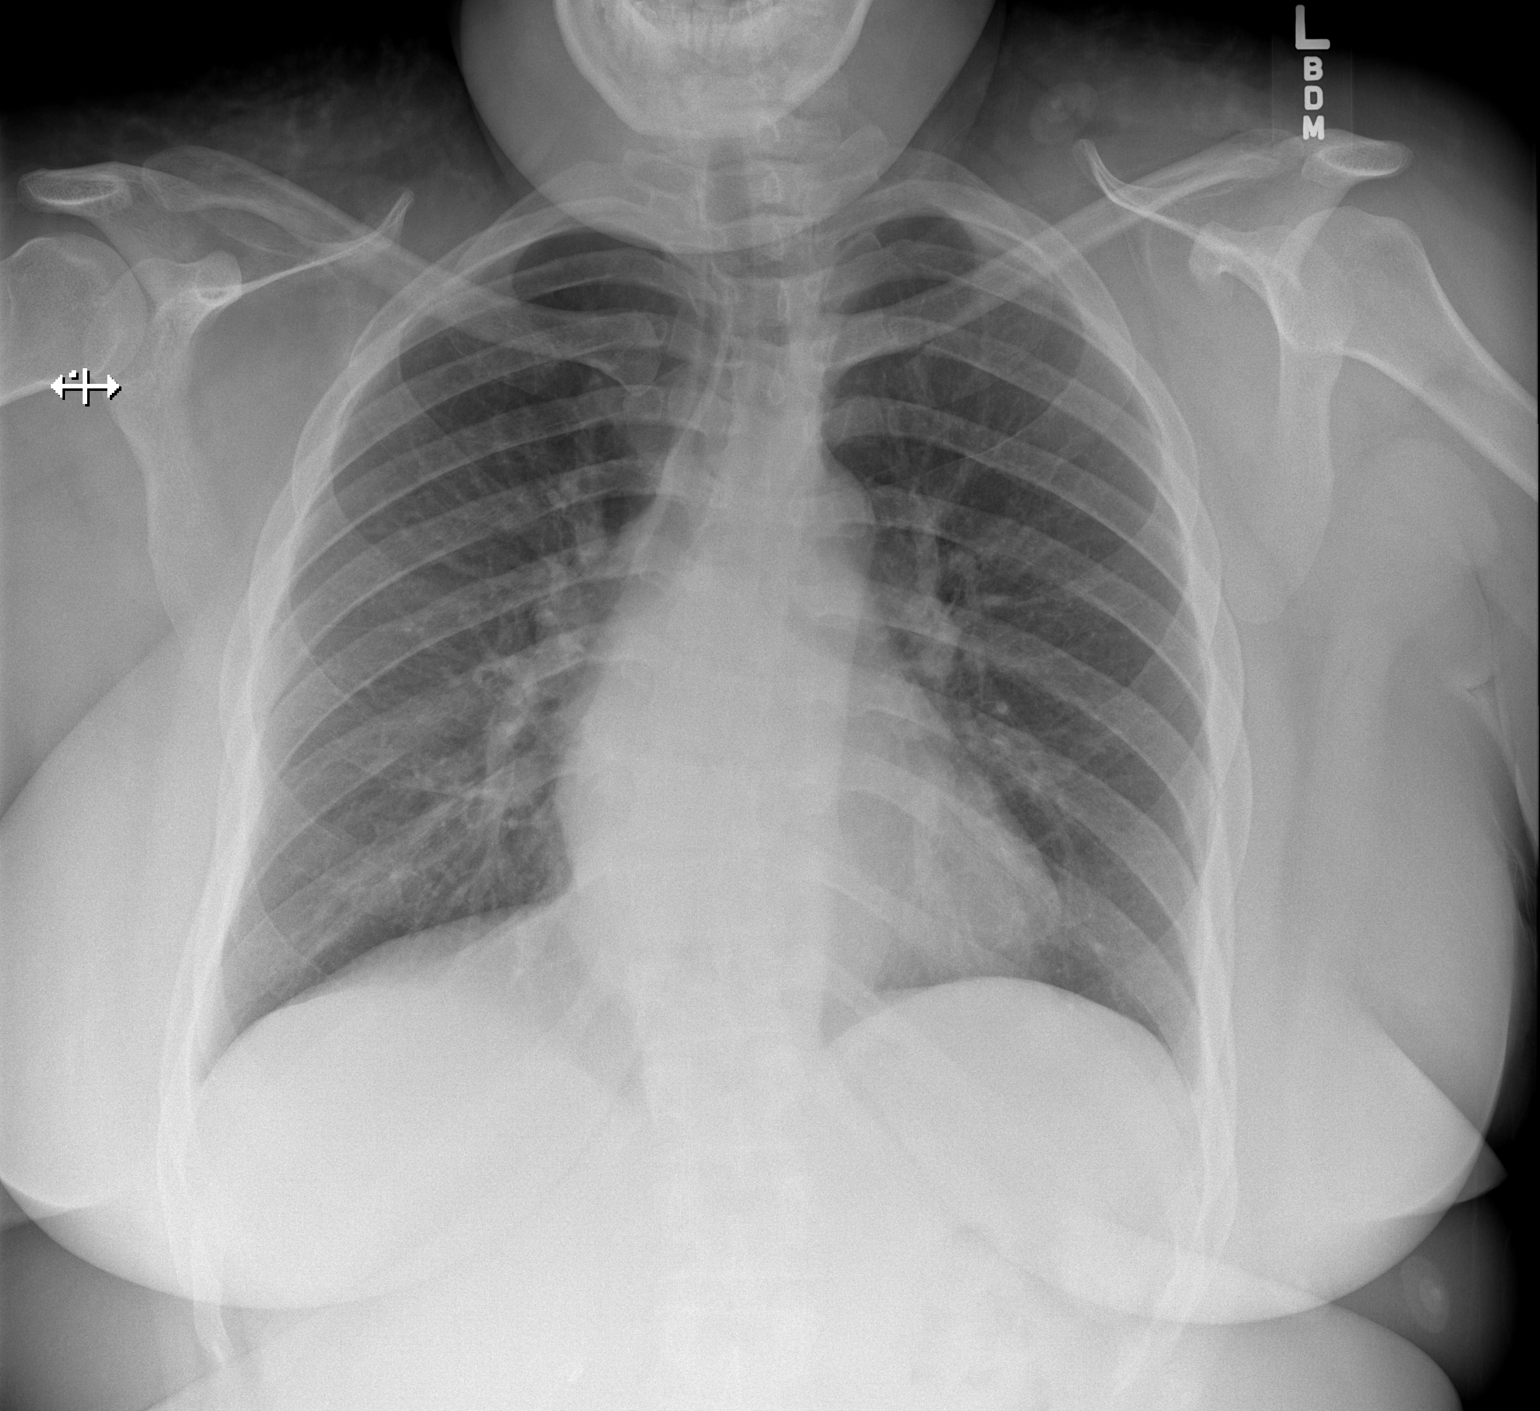

[w chest lat]
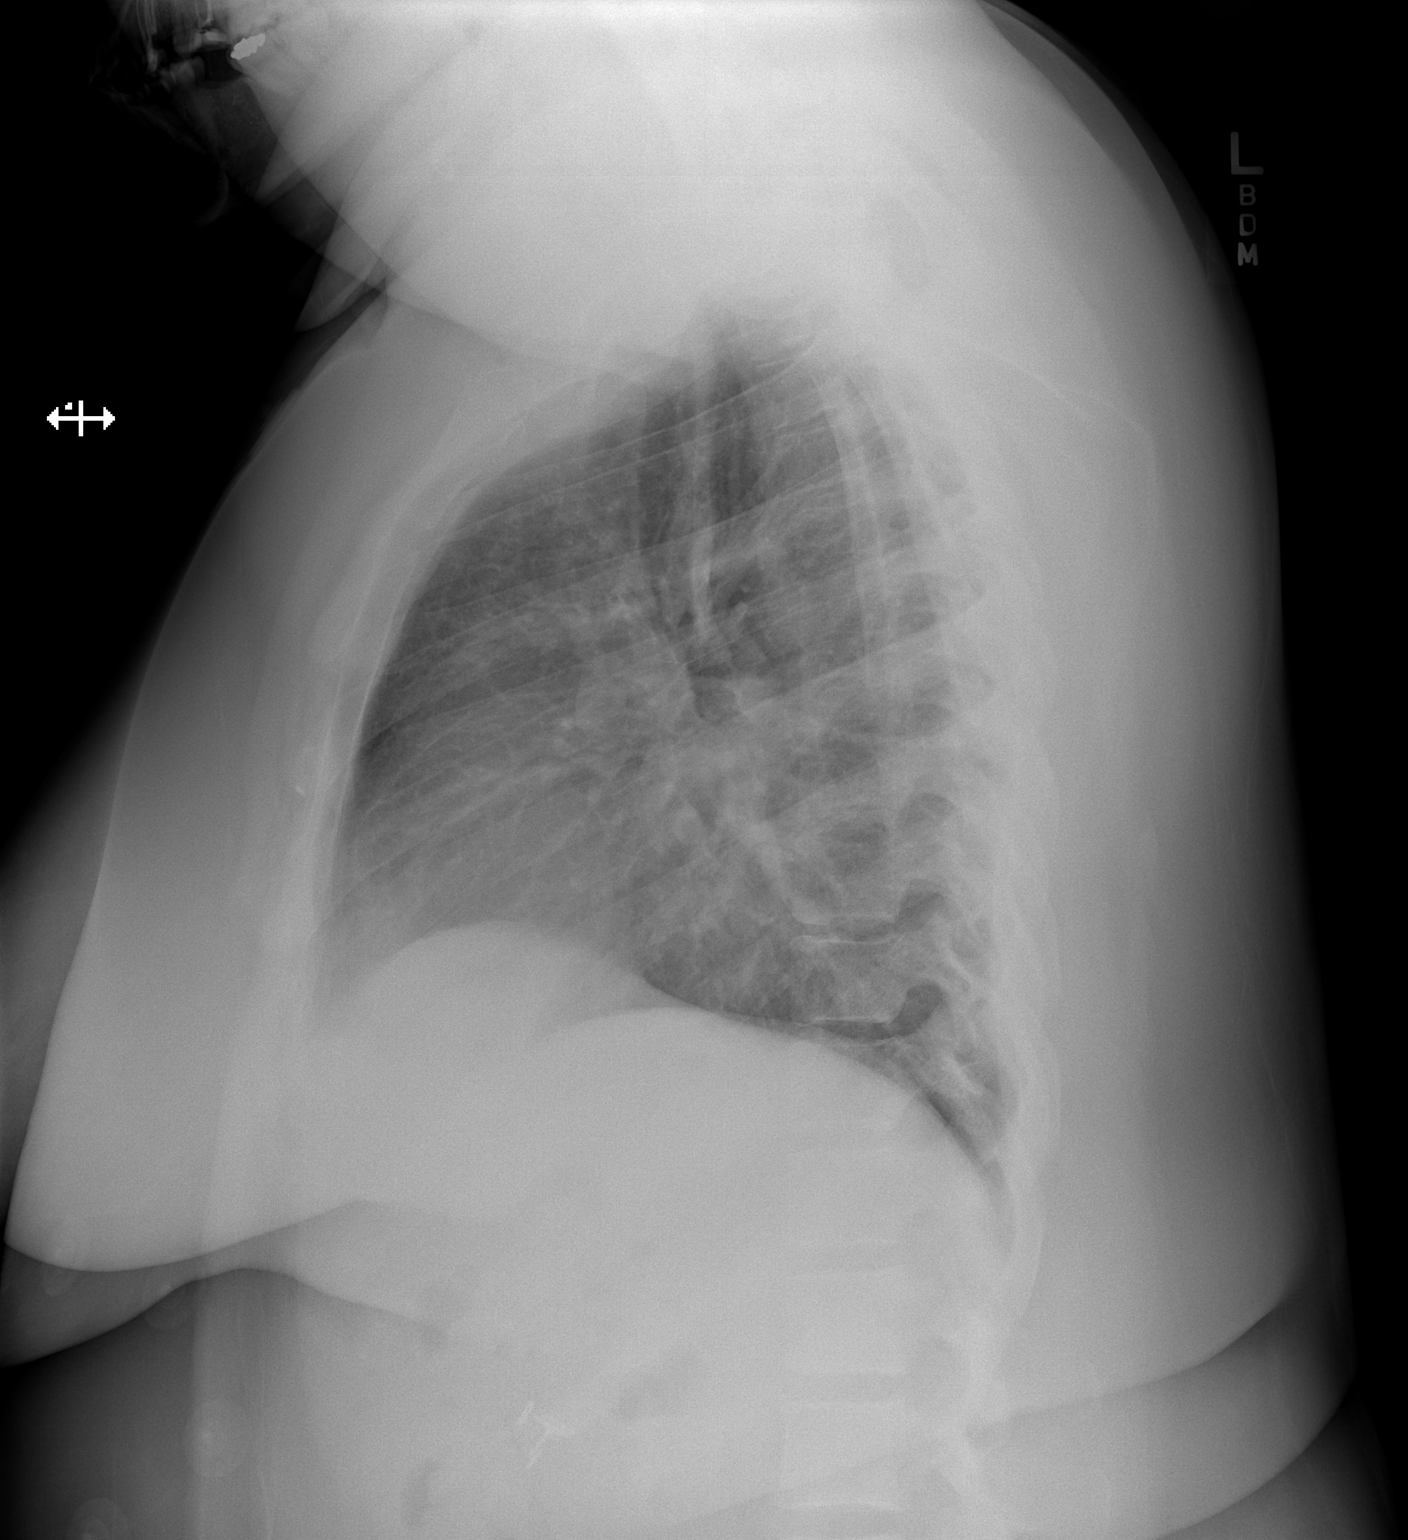

[2 of 2 positions shown; findings below may reference images not displayed]

FINDINGS: The lungs are clear. Heart size is normal. No pneumothorax or
pleural effusion. Thoracolumbar scoliosis is noted. The patient is
status post cholecystectomy.
IMPRESSION: No acute disease.

Scoliosis.

## 2016-07-17 ENCOUNTER — Other Ambulatory Visit: Payer: Self-pay | Admitting: Internal Medicine

## 2016-09-23 ENCOUNTER — Ambulatory Visit (INDEPENDENT_AMBULATORY_CARE_PROVIDER_SITE_OTHER): Payer: Managed Care, Other (non HMO) | Admitting: Internal Medicine

## 2016-09-23 ENCOUNTER — Encounter: Payer: Self-pay | Admitting: Internal Medicine

## 2016-09-23 ENCOUNTER — Ambulatory Visit (HOSPITAL_COMMUNITY)
Admission: RE | Admit: 2016-09-23 | Discharge: 2016-09-23 | Disposition: A | Discharge status: Home / Self Care | Payer: Managed Care, Other (non HMO) | Source: Ambulatory Visit | Attending: Internal Medicine | Admitting: Internal Medicine

## 2016-09-23 VITALS — BP 118/76 | HR 92 | Ht 68.0 in | Wt 254.0 lb

## 2016-09-23 DIAGNOSIS — M7989 Other specified soft tissue disorders: Secondary | ICD-10-CM | POA: Insufficient documentation

## 2016-09-23 NOTE — Progress Notes (Signed)
Pre visit review using our clinic review tool, if applicable. No additional management support is needed unless otherwise documented below in the visit note. 

## 2016-09-23 NOTE — Progress Notes (Signed)
Subjective:    Patient ID: Lori Carpenter, female    DOB: 1981/01/23, 36 y.o.   MRN: [id number]  HPI 35yo F with hx of FMS, chronic venous insufficiency, morbid obesity and anxiety, here after fall 2 wks ago with ? Twisting of the distal leg but not sure; has had persisent acute on chronic LLE swelling and discomfort without erythema, ulcer, f/c and no hx of DVT.  Friend suggested she be checked for DVT.  Pt denies chest pain, increased sob or doe, wheezing, orthopnea, PND, palpitations, dizziness or syncope.  Pt denies new neurological symptoms such as new headache, or facial or extremity weakness or numbness   Pt denies polydipsia, polyuria, Hard to lose wt though she goes to the [place] daily Wt Readings from Last 3 Encounters:  09/23/16 254 lb (115.2 kg)  05/02/16 252 lb (114.3 kg)  01/01/16 249 lb (112.9 kg)   Past Medical History:  Diagnosis Date  . Abdominal pain, unspecified site 10/10/2009  . 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 ALLERGIC RHINITIS 08/19/2007  . ASTHMA 08/19/2007  . CONSTIPATION 10/29/2009  . DIZZINESS 02/13/2010  . Fibromyalgia 01/16/2011  . GASTROENTERITIS 08/19/2007  . GERD 08/19/2007  . HEMATOCHEZIA 03/10/2008  . MYALGIA 10/10/2009  . OBESITY, MORBID 08/19/2007  . PERIPHERAL EDEMA 10/10/2009   Past Surgical History:  Procedure Laterality Date  . CHOLECYSTECTOMY    . ESOPHAGOGASTRODUODENOSCOPY  02/29/2004    reports that she has never smoked. She has never used smokeless tobacco. She reports that she drinks alcohol. She reports that she does not use drugs. family history includes Diabetes in her other; Heart disease in her other; Hypertension in her other. Allergies  Allergen Reactions  . Cabbage Shortness Of Breath and Swelling  . Other Hives, Shortness Of Breath and Swelling    WALNUTS  . Peanut-Containing Drug Products Hives, Shortness Of Breath and Swelling  . Sulfonamide Derivatives Anaphylaxis  . Fish Allergy Swelling   Current Outpatient Prescriptions on File Prior to  Visit  Medication Sig Dispense Refill  . cetirizine (ZYRTEC ALLERGY) 10 MG tablet Take 1 tablet (10 mg total) by mouth daily. 30 tablet 1  . Cholecalciferol (VITAMIN D3) 2000 UNITS capsule Take 1 capsule (2,000 Units total) by mouth daily. 100 capsule 3  . Cyanocobalamin (VITAMIN B-12) 1000 MCG SUBL Place 1 tablet (1,000 mcg total) under the tongue daily. 100 tablet 3  . DULoxetine (CYMBALTA) 60 MG capsule TAKE ONE CAPSULE BY MOUTH EVERY DAY 90 capsule 2  . fluticasone (FLONASE) 50 MCG/ACT nasal spray Place 2 sprays into both nostrils daily. 16 g 0  . furosemide (LASIX) 40 MG tablet TAKE 1 TABLET BY MOUTH EVERY DAY 90 tablet 1  . ibuprofen (ADVIL,MOTRIN) 600 MG tablet Take 1 tablet (600 mg total) by mouth every 8 (eight) hours as needed. 60 tablet 1  . methocarbamol (ROBAXIN) 500 MG tablet Take 1 tablet (500 mg total) by mouth 4 (four) times daily. 60 tablet 2  . naproxen (NAPROSYN) 500 MG tablet Take 1 tablet (500 mg total) by mouth 2 (two) times daily with a meal. 30 tablet 0  . traMADol (ULTRAM) 50 MG tablet Take 1 tablet (50 mg total) by mouth 2 (two) times daily as needed. 60 tablet 1  . Vitamin D, Ergocalciferol, (DRISDOL) 50000 units CAPS capsule TAKE 1 CAPSULE (50,000 UNITS TOTAL) BY MOUTH ONCE A WEEK. 6 capsule 0   No current facility-administered medications on file prior to visit.    Review of Systems  Constitutional: Negative for other  unusual diaphoresis or sweats HENT: Negative for ear discharge or swelling Eyes: Negative for other worsening visual disturbances Respiratory: Negative for stridor or other swelling  Gastrointestinal: Negative for worsening distension or other blood Genitourinary: Negative for retention or other urinary change Musculoskeletal: Negative for other MSK pain or swelling Skin: Negative for color change or other new lesions Neurological: Negative for worsening tremors and other numbness  Psychiatric/Behavioral: Negative for worsening agitation or other  fatigue All other system neg per pt    Objective:   Physical Exam BP 118/76   Pulse 92   Ht  (1.727 m)   Wt 254 lb (115.2 kg)   LMP 09/21/2016   SpO2 99%   BMI 38.62 kg/m  VS noted, not ill appearing Constitutional: Pt appears in NAD HENT: Head: NCAT.  Right Ear: External ear normal.  Left Ear: External ear normal.  Eyes: . Pupils are equal, round, and reactive to light. Conjunctivae and EOM are normal Nose: without d/c or deformity Neck: Neck supple. Gross normal ROM Cardiovascular: Normal rate and regular rhythm.   Pulmonary/Chest: Effort normal and breath sounds without rales or wheezing.  Neurological: Pt is alert. At baseline orientation, motor grossly intact Skin: Skin is warm. No rashes, other new lesions, 1+ LLE edema and trace RLE edema Psychiatric: Pt behavior is normal without agitation mild nervous No other exam findings    Assessment & Plan:

## 2016-09-23 NOTE — Assessment & Plan Note (Signed)
Most likely post trauamatic and to improve or resolve in the next few wks, but cant r/o DVT as well - for LLE venous doppler asap, cont same tx for now

## 2016-09-23 NOTE — Patient Instructions (Signed)
You will be contacted regarding the referral for: left leg test to make sure of blood clot  Please consider wearing compression stockings for the leg swelling  Please continue all other medications as before, and refills have been done if requested.  Please have the pharmacy call with any other refills you may need.  Please continue your efforts at being more active, low cholesterol diet, and weight control.  Please keep your appointments with your specialists as you may have planned

## 2016-09-23 NOTE — Progress Notes (Signed)
*  Preliminary Results* Left lower extremity venous duplex completed. Left lower extremity is negative for deep vein thrombosis. There is no evidence of left Baker's cyst.  09/23/2016 4:45 PM  Gertie Fey, BS, RVT, RDCS, RDMS

## 2016-09-23 NOTE — Assessment & Plan Note (Signed)
Urged pt for increase activity and reduced calories over her current levels to effect wt loss

## 2016-09-24 ENCOUNTER — Encounter: Payer: Self-pay | Admitting: Internal Medicine

## 2016-10-17 ENCOUNTER — Other Ambulatory Visit: Payer: Self-pay | Admitting: Internal Medicine

## 2016-11-27 ENCOUNTER — Ambulatory Visit (INDEPENDENT_AMBULATORY_CARE_PROVIDER_SITE_OTHER): Payer: Managed Care, Other (non HMO) | Admitting: Internal Medicine

## 2016-11-27 DIAGNOSIS — M797 Fibromyalgia: Secondary | ICD-10-CM

## 2016-11-27 DIAGNOSIS — G4710 Hypersomnia, unspecified: Secondary | ICD-10-CM | POA: Diagnosis not present

## 2016-11-27 DIAGNOSIS — F419 Anxiety disorder, unspecified: Secondary | ICD-10-CM | POA: Diagnosis not present

## 2016-11-27 DIAGNOSIS — F32A Depression, unspecified: Secondary | ICD-10-CM

## 2016-11-27 DIAGNOSIS — F329 Major depressive disorder, single episode, unspecified: Secondary | ICD-10-CM

## 2016-11-27 MED ORDER — PHENTERMINE HCL 37.5 MG PO CAPS: 37.5000 mg | ORAL_CAPSULE | ORAL | 2 refills | Status: AC

## 2016-11-27 NOTE — Progress Notes (Signed)
Subjective:    Patient ID: Lori Carpenter, female    DOB: 02-02-1981, 36 y.o.   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  They want a dog but she wont do it as she is already struggling.  Did have some success with 1 mo phentermine last yr, tolerated OK, did not take the refills.  States husband says she does not snore or stop breathing at night, and does not believe she has OSA.  Does have teeth grinding but does not wear her mouthguard. Denies worsening depressive symptoms, suicidal ideation, or panic; has ongoing anxiety . Pt denies chest pain, increased sob or doe, wheezing, orthopnea, PND, increased LE swelling, palpitations, dizziness or syncope. Wt Readings from Last 3 Encounters:  11/27/16 255 lb (115.7 kg)  09/23/16 254 lb (115.2 kg)  05/02/16 252 lb (114.3 kg)   Past Medical History:  Diagnosis Date  . Abdominal pain, unspecified site 10/10/2009  . 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 ALLERGIC RHINITIS 08/19/2007  . ASTHMA 08/19/2007  . CONSTIPATION 10/29/2009  . DIZZINESS 02/13/2010  . Fibromyalgia 01/16/2011  . GASTROENTERITIS 08/19/2007  . GERD 08/19/2007  . HEMATOCHEZIA 03/10/2008  . MYALGIA 10/10/2009  . OBESITY, MORBID 08/19/2007  . PERIPHERAL EDEMA 10/10/2009   Past Surgical History:  Procedure Laterality Date  . CHOLECYSTECTOMY    . ESOPHAGOGASTRODUODENOSCOPY  02/29/2004    reports that she has never smoked. She has never used smokeless tobacco. She reports that she drinks alcohol. She reports that she does not use drugs. family history includes Diabetes in her other; Heart disease in her other; Hypertension in her other. Allergies  Allergen Reactions  . Cabbage Shortness Of Breath and Swelling  . Other Hives, Shortness Of  Breath and Swelling    WALNUTS  . Peanut-Containing Drug Products Hives, Shortness Of Breath and Swelling  . Sulfonamide Derivatives Anaphylaxis  . Fish Allergy Swelling   Current Outpatient Prescriptions on File Prior to Visit  Medication Sig Dispense Refill  . cetirizine (ZYRTEC ALLERGY) 10 MG tablet Take 1 tablet (10 mg total) by mouth daily. 30 tablet 1  . Cholecalciferol (VITAMIN D3) 2000 UNITS capsule Take 1 capsule (2,000 Units total) by mouth daily. 100 capsule 3  . Cyanocobalamin (VITAMIN B-12) 1000 MCG SUBL Place 1 tablet (1,000 mcg total) under the tongue daily. 100 tablet 3  . DULoxetine (CYMBALTA) 60 MG capsule TAKE ONE CAPSULE BY MOUTH EVERY DAY 90 capsule 2  . fluticasone (FLONASE) 50 MCG/ACT nasal spray Place 2 sprays into both nostrils daily. 16 g 0  . furosemide (LASIX) 40 MG tablet TAKE 1 TABLET BY MOUTH EVERY DAY 90 tablet 1  . ibuprofen (ADVIL,MOTRIN) 600 MG tablet Take 1 tablet (600 mg total) by mouth every 8 (eight) hours as needed. 60 tablet 1  . methocarbamol (ROBAXIN) 500 MG tablet Take 1 tablet (500 mg total) by mouth 4 (four) times daily. 60 tablet 2  . naproxen (NAPROSYN) 500 MG tablet Take 1 tablet (500 mg total) by mouth 2 (two) times daily with a meal. 30 tablet 0  . traMADol (ULTRAM) 50 MG tablet Take 1 tablet (50 mg total) by mouth 2 (two) times daily as needed. 60 tablet 1  . Vitamin D, Ergocalciferol, (DRISDOL) 50000 units CAPS capsule TAKE  1 CAPSULE (50,000 UNITS TOTAL) BY MOUTH ONCE A WEEK. 6 capsule 0   No current facility-administered medications on file prior to visit.    Review of Systems  Constitutional: Negative for other unusual diaphoresis or sweats HENT: Negative for ear discharge or swelling Eyes: Negative for other worsening visual disturbances Respiratory: Negative for stridor or other swelling  Gastrointestinal: Negative for worsening distension or other blood Genitourinary: Negative for retention or other urinary change Musculoskeletal:  Negative for other MSK pain or swelling Skin: Negative for color change or other new lesions Neurological: Negative for worsening tremors and other numbness  Psychiatric/Behavioral: Negative for worsening agitation or other fatigue All other system neg per pt    Objective:   Physical Exam BP 124/82   Pulse 99   Ht 5\' 8"  (1.727 m)   Wt 255 lb (115.7 kg)   SpO2 99%   BMI 38.77 kg/m  VS noted, obese Constitutional: Pt appears in NAD HENT: Head: NCAT.  Right Ear: External ear normal.  Left Ear: External ear normal.  Eyes: . Pupils are equal, round, and reactive to light. Conjunctivae and EOM are normal Nose: without d/c or deformity Neck: Neck supple. Gross normal ROM Cardiovascular: Normal rate and regular rhythm.   Pulmonary/Chest: Effort normal and breath sounds without rales or wheezing.  Abd:  Soft, NT, ND, + BS, no organomegaly Neurological: Pt is alert. At baseline orientation, motor grossly intact Skin: Skin is warm. No rashes, other new lesions, no LE edema Psychiatric: Pt behavior is normal without agitation , 1+ nervous No other exam findings    Assessment & Plan:

## 2016-11-27 NOTE — Assessment & Plan Note (Signed)
To cont current tx, does not appear to need PT at this time

## 2016-11-27 NOTE — Assessment & Plan Note (Signed)
?   Sleep apnea but she does not feel this is likely and declines referral to pulm

## 2016-11-27 NOTE — Assessment & Plan Note (Signed)
With worsening stress related symptoms recently, to cont same tx but refer counseling

## 2016-11-27 NOTE — Patient Instructions (Signed)
Please take all new medication as prescribed - the weight loss medication  You will be contacted regarding the referral for: counseling  Please continue all other medications as before, and refills have been done if requested.  Please have the pharmacy call with any other refills you may need.  Please continue your efforts at being more active, low cholesterol diet, and weight control.  Please keep your appointments with your specialists as you may have planned

## 2016-11-27 NOTE — Assessment & Plan Note (Signed)
Ok for phentermine asd,  to f/u any worsening symptoms or concerns °

## 2016-12-22 ENCOUNTER — Encounter: Payer: Self-pay | Admitting: Internal Medicine

## 2017-03-20 ENCOUNTER — Other Ambulatory Visit: Payer: Self-pay | Admitting: Internal Medicine

## 2017-06-17 ENCOUNTER — Other Ambulatory Visit: Payer: Self-pay | Admitting: Internal Medicine

## 2023-08-05 DIAGNOSIS — R519 Headache, unspecified: Secondary | ICD-10-CM | POA: Diagnosis not present

## 2023-09-01 DIAGNOSIS — F419 Anxiety disorder, unspecified: Secondary | ICD-10-CM | POA: Diagnosis not present

## 2023-09-01 DIAGNOSIS — F331 Major depressive disorder, recurrent, moderate: Secondary | ICD-10-CM | POA: Diagnosis not present

## 2023-09-01 DIAGNOSIS — Z7901 Long term (current) use of anticoagulants: Secondary | ICD-10-CM | POA: Diagnosis not present

## 2023-09-01 DIAGNOSIS — M797 Fibromyalgia: Secondary | ICD-10-CM | POA: Diagnosis not present

## 2023-09-01 DIAGNOSIS — Z86718 Personal history of other venous thrombosis and embolism: Secondary | ICD-10-CM | POA: Diagnosis not present

## 2023-09-03 DIAGNOSIS — Z8719 Personal history of other diseases of the digestive system: Secondary | ICD-10-CM | POA: Diagnosis not present

## 2023-09-03 DIAGNOSIS — K3184 Gastroparesis: Secondary | ICD-10-CM | POA: Diagnosis not present

## 2023-09-15 DIAGNOSIS — B379 Candidiasis, unspecified: Secondary | ICD-10-CM | POA: Diagnosis not present

## 2023-09-15 DIAGNOSIS — E669 Obesity, unspecified: Secondary | ICD-10-CM | POA: Diagnosis not present

## 2023-09-15 DIAGNOSIS — A5901 Trichomonal vulvovaginitis: Secondary | ICD-10-CM | POA: Diagnosis not present

## 2023-09-15 DIAGNOSIS — K220 Achalasia of cardia: Secondary | ICD-10-CM | POA: Diagnosis not present

## 2023-10-21 DIAGNOSIS — R109 Unspecified abdominal pain: Secondary | ICD-10-CM | POA: Diagnosis not present

## 2023-11-06 DIAGNOSIS — N760 Acute vaginitis: Secondary | ICD-10-CM | POA: Diagnosis not present

## 2023-12-08 DIAGNOSIS — Z1231 Encounter for screening mammogram for malignant neoplasm of breast: Secondary | ICD-10-CM | POA: Diagnosis not present

## 2023-12-08 DIAGNOSIS — Z975 Presence of (intrauterine) contraceptive device: Secondary | ICD-10-CM | POA: Diagnosis not present

## 2023-12-08 DIAGNOSIS — E66811 Obesity, class 1: Secondary | ICD-10-CM | POA: Diagnosis not present

## 2023-12-08 DIAGNOSIS — Z0000 Encounter for general adult medical examination without abnormal findings: Secondary | ICD-10-CM | POA: Diagnosis not present

## 2023-12-08 DIAGNOSIS — R5383 Other fatigue: Secondary | ICD-10-CM | POA: Diagnosis not present

## 2024-01-05 DIAGNOSIS — F331 Major depressive disorder, recurrent, moderate: Secondary | ICD-10-CM | POA: Diagnosis not present

## 2024-01-05 DIAGNOSIS — Z86718 Personal history of other venous thrombosis and embolism: Secondary | ICD-10-CM | POA: Diagnosis not present

## 2024-01-05 DIAGNOSIS — Z3009 Encounter for other general counseling and advice on contraception: Secondary | ICD-10-CM | POA: Diagnosis not present

## 2024-01-05 DIAGNOSIS — E66811 Obesity, class 1: Secondary | ICD-10-CM | POA: Diagnosis not present

## 2024-01-05 DIAGNOSIS — Z7901 Long term (current) use of anticoagulants: Secondary | ICD-10-CM | POA: Diagnosis not present

## 2024-02-01 DIAGNOSIS — F419 Anxiety disorder, unspecified: Secondary | ICD-10-CM | POA: Diagnosis not present

## 2024-02-01 DIAGNOSIS — Z124 Encounter for screening for malignant neoplasm of cervix: Secondary | ICD-10-CM | POA: Diagnosis not present

## 2024-02-01 DIAGNOSIS — Z0000 Encounter for general adult medical examination without abnormal findings: Secondary | ICD-10-CM | POA: Diagnosis not present

## 2024-02-09 DIAGNOSIS — E66811 Obesity, class 1: Secondary | ICD-10-CM | POA: Diagnosis not present

## 2024-02-17 DIAGNOSIS — J45909 Unspecified asthma, uncomplicated: Secondary | ICD-10-CM | POA: Diagnosis not present

## 2024-02-17 DIAGNOSIS — K2289 Other specified disease of esophagus: Secondary | ICD-10-CM | POA: Diagnosis not present

## 2024-02-17 DIAGNOSIS — F418 Other specified anxiety disorders: Secondary | ICD-10-CM | POA: Diagnosis not present

## 2024-02-17 DIAGNOSIS — K311 Adult hypertrophic pyloric stenosis: Secondary | ICD-10-CM | POA: Diagnosis not present

## 2024-02-17 DIAGNOSIS — Z9889 Other specified postprocedural states: Secondary | ICD-10-CM | POA: Diagnosis not present

## 2024-02-17 DIAGNOSIS — R1310 Dysphagia, unspecified: Secondary | ICD-10-CM | POA: Diagnosis not present

## 2024-02-17 DIAGNOSIS — K220 Achalasia of cardia: Secondary | ICD-10-CM | POA: Diagnosis not present

## 2024-02-17 DIAGNOSIS — K209 Esophagitis, unspecified without bleeding: Secondary | ICD-10-CM | POA: Diagnosis not present

## 2024-04-15 DIAGNOSIS — Z86711 Personal history of pulmonary embolism: Secondary | ICD-10-CM | POA: Diagnosis not present

## 2024-04-15 DIAGNOSIS — R531 Weakness: Secondary | ICD-10-CM | POA: Diagnosis not present

## 2024-04-15 DIAGNOSIS — E663 Overweight: Secondary | ICD-10-CM | POA: Diagnosis not present
